# Patient Record
Sex: Female | Born: 1970 | Race: White | Hispanic: No | Marital: Married | Smoking: Former smoker
Health system: Southern US, Community
[De-identification: ages and names within clinical notes are randomized; demographics above are authoritative.]

## PROBLEM LIST (undated history)

## (undated) DIAGNOSIS — R002 Palpitations: Secondary | ICD-10-CM

## (undated) DIAGNOSIS — M26629 Arthralgia of temporomandibular joint, unspecified side: Secondary | ICD-10-CM

## (undated) DIAGNOSIS — K219 Gastro-esophageal reflux disease without esophagitis: Secondary | ICD-10-CM

## (undated) DIAGNOSIS — R350 Frequency of micturition: Secondary | ICD-10-CM

## (undated) HISTORY — DX: Arthralgia of temporomandibular joint, unspecified side: M26.629

## (undated) HISTORY — DX: Gastro-esophageal reflux disease without esophagitis: K21.9

## (undated) HISTORY — DX: Frequency of micturition: R35.0

## (undated) HISTORY — DX: Palpitations: R00.2

---

## 1998-04-09 ENCOUNTER — Other Ambulatory Visit: Admission: RE | Admit: 1998-04-09 | Discharge: 1998-04-09 | Payer: Self-pay | Admitting: Obstetrics & Gynecology

## 1998-11-12 ENCOUNTER — Inpatient Hospital Stay (HOSPITAL_COMMUNITY): Admission: AD | Admit: 1998-11-12 | Discharge: 1998-11-15 | Payer: Self-pay | Admitting: Obstetrics and Gynecology

## 1999-04-20 ENCOUNTER — Other Ambulatory Visit: Admission: RE | Admit: 1999-04-20 | Discharge: 1999-04-20 | Payer: Self-pay | Admitting: Obstetrics & Gynecology

## 2000-05-09 ENCOUNTER — Other Ambulatory Visit: Admission: RE | Admit: 2000-05-09 | Discharge: 2000-05-09 | Payer: Self-pay | Admitting: Gynecology

## 2001-12-13 ENCOUNTER — Emergency Department (HOSPITAL_COMMUNITY): Admission: EM | Admit: 2001-12-13 | Discharge: 2001-12-13 | Payer: Self-pay | Admitting: Emergency Medicine

## 2001-12-25 ENCOUNTER — Emergency Department (HOSPITAL_COMMUNITY): Admission: EM | Admit: 2001-12-25 | Discharge: 2001-12-25 | Payer: Self-pay | Admitting: Emergency Medicine

## 2004-03-10 ENCOUNTER — Other Ambulatory Visit: Admission: RE | Admit: 2004-03-10 | Discharge: 2004-03-10 | Payer: Self-pay | Admitting: Obstetrics & Gynecology

## 2011-01-10 ENCOUNTER — Encounter: Payer: Self-pay | Admitting: Internal Medicine

## 2011-12-22 ENCOUNTER — Ambulatory Visit (INDEPENDENT_AMBULATORY_CARE_PROVIDER_SITE_OTHER): Payer: BC Managed Care – PPO

## 2011-12-22 DIAGNOSIS — R300 Dysuria: Secondary | ICD-10-CM

## 2011-12-22 DIAGNOSIS — N949 Unspecified condition associated with female genital organs and menstrual cycle: Secondary | ICD-10-CM

## 2011-12-22 DIAGNOSIS — N898 Other specified noninflammatory disorders of vagina: Secondary | ICD-10-CM

## 2012-08-10 ENCOUNTER — Ambulatory Visit (INDEPENDENT_AMBULATORY_CARE_PROVIDER_SITE_OTHER): Payer: BC Managed Care – PPO | Admitting: Physician Assistant

## 2012-08-10 VITALS — BP 146/72 | HR 122 | Temp 97.9°F | Resp 16 | Ht 64.0 in | Wt 148.0 lb

## 2012-08-10 DIAGNOSIS — Z23 Encounter for immunization: Secondary | ICD-10-CM

## 2012-08-10 DIAGNOSIS — S61209A Unspecified open wound of unspecified finger without damage to nail, initial encounter: Secondary | ICD-10-CM

## 2012-08-10 DIAGNOSIS — M79609 Pain in unspecified limb: Secondary | ICD-10-CM

## 2012-08-10 NOTE — Progress Notes (Signed)
  Subjective:    Patient ID: Emily Hayden, female    DOB: 05-26-1971, 41 y.o.   MRN: [id number]  HPI 40 yr old CF presents with cut on L pinky finger with sharp new blade while cutting paper.   Review of Systems  All other systems reviewed and are negative.       Objective:   Physical Exam  Nursing note and vitals reviewed. Constitutional: She appears well-developed and well-nourished.  Musculoskeletal:       Hands:         Assessment & Plan:  Wound-finger-skin may or may not re-attach, but the wound is very small and should do fine. Definitely not something that would require a foil graft.  tdap updated.

## 2012-10-30 ENCOUNTER — Ambulatory Visit (INDEPENDENT_AMBULATORY_CARE_PROVIDER_SITE_OTHER): Payer: BC Managed Care – PPO | Admitting: Family Medicine

## 2012-10-30 VITALS — BP 133/81 | HR 111 | Temp 97.6°F | Resp 16 | Ht 64.0 in | Wt 144.4 lb

## 2012-10-30 DIAGNOSIS — M546 Pain in thoracic spine: Secondary | ICD-10-CM

## 2012-10-30 DIAGNOSIS — Z23 Encounter for immunization: Secondary | ICD-10-CM

## 2012-10-30 MED ORDER — HYDROCODONE-ACETAMINOPHEN 5-500 MG PO TABS: 1.0000 | ORAL_TABLET | Freq: Three times a day (TID) | ORAL | Status: DC | PRN

## 2012-10-30 MED ORDER — CYCLOBENZAPRINE HCL 10 MG PO TABS: ORAL_TABLET | ORAL | Status: DC

## 2012-10-30 NOTE — Progress Notes (Signed)
This is a 41 y.o.female who initially felt interscapular pain 2 weeks ago, then spent 6-7 hours moving furniture on Saturday, then this afternoon she felt a little pop and the pain crescendo's.  Has had some more pain in left upper arm than the right..   Patient has a past history of upper back pain for which she usually takes muscle relaxer and ibuprofen  Emily Hayden denies any urinary symptoms, bowel problems, numbness in the legs, loss of motor power. Emily Hayden had no fever.  Emily Hayden has tried ibuprofen.  History reviewed. No pertinent past medical history.   Past Surgical History  Procedure Date  . Cesarean section     Objective:  middle-aged female in no acute distress. Blood pressure 133/81, pulse 111, temperature 97.6 F (36.4 C), temperature source Oral, resp. rate 16, height 5\' 4"  (1.626 m), weight 144 lb 6.4 oz (65.499 kg), last menstrual period 10/09/2012, SpO2 100.00%.Body mass index is 24.79 kg/(m^2). Palpation of the back reveals no point tenderness of the spine  Inspection of the back: Reveals no scoliosis  Motor exam of lower extremity: No abnormal weakness.  Reflexes: Symmetric and normal  Skin exam: normal  Assessment/Plan: Acute upper back pain without acute neurological findings.  1. Thoracic back pain  HYDROcodone-acetaminophen (VICODIN) 5-500 MG per tablet

## 2012-10-31 ENCOUNTER — Telehealth: Payer: Self-pay | Admitting: *Deleted

## 2012-10-31 MED ORDER — CYCLOBENZAPRINE HCL 10 MG PO TABS: 10.0000 mg | ORAL_TABLET | Freq: Three times a day (TID) | ORAL | Status: DC | PRN

## 2012-10-31 NOTE — Telephone Encounter (Signed)
Done

## 2012-10-31 NOTE — Telephone Encounter (Signed)
Pt was seen 11/11 and her rx for flexeril was written for 1 at bedtime but pt stated at pharmacy that she usually take 1 tab every 8 hours.  Can we change the directions?  [place] sent [place] a fax

## 2012-10-31 NOTE — Telephone Encounter (Signed)
Please call in Flexeril 10 tid prn #90, 2 refills

## 2012-12-10 ENCOUNTER — Other Ambulatory Visit: Payer: Self-pay | Admitting: Internal Medicine

## 2013-03-17 ENCOUNTER — Other Ambulatory Visit: Payer: Self-pay | Admitting: Internal Medicine

## 2013-03-17 ENCOUNTER — Other Ambulatory Visit: Payer: Self-pay | Admitting: Family Medicine

## 2013-04-04 ENCOUNTER — Encounter: Payer: Self-pay | Admitting: Internal Medicine

## 2013-04-04 ENCOUNTER — Ambulatory Visit (INDEPENDENT_AMBULATORY_CARE_PROVIDER_SITE_OTHER): Payer: BC Managed Care – PPO | Admitting: Internal Medicine

## 2013-04-04 VITALS — BP 116/66 | HR 102 | Temp 98.6°F | Resp 16 | Ht 64.0 in | Wt 147.6 lb

## 2013-04-04 DIAGNOSIS — J387 Other diseases of larynx: Secondary | ICD-10-CM

## 2013-04-04 DIAGNOSIS — M26629 Arthralgia of temporomandibular joint, unspecified side: Secondary | ICD-10-CM

## 2013-04-04 DIAGNOSIS — R002 Palpitations: Secondary | ICD-10-CM

## 2013-04-04 DIAGNOSIS — M2669 Other specified disorders of temporomandibular joint: Secondary | ICD-10-CM

## 2013-04-04 DIAGNOSIS — K219 Gastro-esophageal reflux disease without esophagitis: Secondary | ICD-10-CM

## 2013-04-04 DIAGNOSIS — R350 Frequency of micturition: Secondary | ICD-10-CM

## 2013-04-05 DIAGNOSIS — K219 Gastro-esophageal reflux disease without esophagitis: Secondary | ICD-10-CM | POA: Insufficient documentation

## 2013-04-05 DIAGNOSIS — R002 Palpitations: Secondary | ICD-10-CM | POA: Insufficient documentation

## 2013-04-05 DIAGNOSIS — M26629 Arthralgia of temporomandibular joint, unspecified side: Secondary | ICD-10-CM

## 2013-04-05 DIAGNOSIS — R350 Frequency of micturition: Secondary | ICD-10-CM | POA: Insufficient documentation

## 2013-04-05 HISTORY — DX: Arthralgia of temporomandibular joint, unspecified side: M26.629

## 2013-04-05 HISTORY — DX: Gastro-esophageal reflux disease without esophagitis: K21.9

## 2013-04-05 HISTORY — DX: Frequency of micturition: R35.0

## 2013-04-05 HISTORY — DX: Palpitations: R00.2

## 2013-04-05 MED ORDER — CYCLOBENZAPRINE HCL 10 MG PO TABS: ORAL_TABLET | ORAL | Status: DC

## 2013-04-05 MED ORDER — PANTOPRAZOLE SODIUM 40 MG PO TBEC: DELAYED_RELEASE_TABLET | ORAL | Status: DC

## 2013-04-05 NOTE — Progress Notes (Signed)
  Subjective:    Patient ID: Emily Hayden, female    DOB: Jan 22, 1971, 42 y.o.   MRN: [id number]  HPI here for routine followup and medicine refills Has had a good recent year By using Flexeril at bedtime without Mobic she has good control of her temporomandibular joint pain Her reflux syndrome is probably laryngeal pharyngeal reflux is well controlled by Protonix although she has been unable to wean off of this/1 chief symptom is noticing premature beats when her reflux is active//another symptom resolved Protonix was persistent nausea  History of palpitations has been fully evaluated by cardiology and no problem has been found  She has had intermittent anxiety with insomnia although uses Xanax 1-2 times a month at the most  She was referred to urology at [facility] to evaluate her urinary frequency and no disorder was discovered  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 Multiple Vitamin CAPS    Sig: Take by mouth 2 (two) times daily.  . chlorpheniramine (CHLOR-TRIMETON) 4 MG tablet    Sig: Take 4 mg by mouth 2 (two) times daily as needed for allergies.  . cyclobenzaprine (FLEXERIL) 10 MG tablet    Sig: TAKE 1 TABLET THREE TIMES DAILY AS NEEDED FOR MUSCLE SPASMS.    Dispense:  90 tablet    Refill:  3  . pantoprazole (PROTONIX) 40 MG tablet    Sig: TAKE 1 TABLET ONCE DAILY.    Dispense:  90 tablet    Refill:  3   1 yr May call for refills of Xanax if needed

## 2014-01-14 ENCOUNTER — Other Ambulatory Visit: Payer: Self-pay | Admitting: Internal Medicine

## 2014-03-23 ENCOUNTER — Other Ambulatory Visit: Payer: Self-pay | Admitting: Physician Assistant

## 2014-04-20 ENCOUNTER — Other Ambulatory Visit: Payer: Self-pay | Admitting: Internal Medicine

## 2014-05-15 ENCOUNTER — Ambulatory Visit (INDEPENDENT_AMBULATORY_CARE_PROVIDER_SITE_OTHER): Payer: BC Managed Care – PPO | Admitting: Internal Medicine

## 2014-05-15 ENCOUNTER — Encounter: Payer: Self-pay | Admitting: Internal Medicine

## 2014-05-15 VITALS — BP 100/72 | HR 119 | Temp 99.1°F | Resp 16 | Ht 64.0 in | Wt 156.6 lb

## 2014-05-15 DIAGNOSIS — M26629 Arthralgia of temporomandibular joint, unspecified side: Secondary | ICD-10-CM

## 2014-05-15 DIAGNOSIS — I498 Other specified cardiac arrhythmias: Secondary | ICD-10-CM

## 2014-05-15 DIAGNOSIS — Z800 Family history of malignant neoplasm of digestive organs: Secondary | ICD-10-CM

## 2014-05-15 DIAGNOSIS — R350 Frequency of micturition: Secondary | ICD-10-CM

## 2014-05-15 DIAGNOSIS — Z5181 Encounter for therapeutic drug level monitoring: Secondary | ICD-10-CM

## 2014-05-15 DIAGNOSIS — Z79899 Other long term (current) drug therapy: Secondary | ICD-10-CM

## 2014-05-15 DIAGNOSIS — R000 Tachycardia, unspecified: Secondary | ICD-10-CM

## 2014-05-15 DIAGNOSIS — K219 Gastro-esophageal reflux disease without esophagitis: Secondary | ICD-10-CM

## 2014-05-15 DIAGNOSIS — R002 Palpitations: Secondary | ICD-10-CM

## 2014-05-15 DIAGNOSIS — R3981 Functional urinary incontinence: Secondary | ICD-10-CM

## 2014-05-15 DIAGNOSIS — M2669 Other specified disorders of temporomandibular joint: Secondary | ICD-10-CM

## 2014-05-15 DIAGNOSIS — J387 Other diseases of larynx: Secondary | ICD-10-CM

## 2014-05-15 LAB — CBC WITH DIFFERENTIAL/PLATELET
BASOS PCT: 0 % (ref 0–1)
Basophils Absolute: 0 10*3/uL (ref 0.0–0.1)
EOS ABS: 0.1 10*3/uL (ref 0.0–0.7)
Eosinophils Relative: 2 % (ref 0–5)
HEMATOCRIT: 38.3 % (ref 36.0–46.0)
HEMOGLOBIN: 13.1 g/dL (ref 12.0–15.0)
LYMPHS ABS: 1.3 10*3/uL (ref 0.7–4.0)
Lymphocytes Relative: 36 % (ref 12–46)
MCH: 30.6 pg (ref 26.0–34.0)
MCHC: 34.2 g/dL (ref 30.0–36.0)
MCV: 89.5 fL (ref 78.0–100.0)
MONOS PCT: 12 % (ref 3–12)
Monocytes Absolute: 0.4 10*3/uL (ref 0.1–1.0)
NEUTROS ABS: 1.8 10*3/uL (ref 1.7–7.7)
NEUTROS PCT: 50 % (ref 43–77)
Platelets: 421 10*3/uL — ABNORMAL HIGH (ref 150–400)
RBC: 4.28 MIL/uL (ref 3.87–5.11)
RDW: 13.9 % (ref 11.5–15.5)
WBC: 3.6 10*3/uL — ABNORMAL LOW (ref 4.0–10.5)

## 2014-05-15 LAB — LIPID PANEL
Cholesterol: 229 mg/dL — ABNORMAL HIGH (ref 0–200)
HDL: 77 mg/dL (ref >39)
LDL Cholesterol: 130 mg/dL — ABNORMAL HIGH (ref 0–99)
TRIGLYCERIDES: 108 mg/dL (ref <150)
Total CHOL/HDL Ratio: 3 Ratio
VLDL: 22 mg/dL (ref 0–40)

## 2014-05-15 LAB — COMPREHENSIVE METABOLIC PANEL
ALBUMIN: 4.4 g/dL (ref 3.5–5.2)
ALT: 8 U/L (ref 0–35)
AST: 15 U/L (ref 0–37)
Alkaline Phosphatase: 44 U/L (ref 39–117)
BUN: 17 mg/dL (ref 6–23)
CO2: 29 meq/L (ref 19–32)
Calcium: 9.4 mg/dL (ref 8.4–10.5)
Chloride: 99 mEq/L (ref 96–112)
Creat: 0.76 mg/dL (ref 0.50–1.10)
Glucose, Bld: 82 mg/dL (ref 70–99)
POTASSIUM: 4.5 meq/L (ref 3.5–5.3)
SODIUM: 137 meq/L (ref 135–145)
TOTAL PROTEIN: 6.8 g/dL (ref 6.0–8.3)
Total Bilirubin: 0.3 mg/dL (ref 0.2–1.2)

## 2014-05-15 LAB — MAGNESIUM: MAGNESIUM: 1.9 mg/dL (ref 1.5–2.5)

## 2014-05-15 MED ORDER — CYCLOBENZAPRINE HCL 10 MG PO TABS: 10.0000 mg | ORAL_TABLET | Freq: Three times a day (TID) | ORAL | Status: DC | PRN

## 2014-05-15 MED ORDER — PANTOPRAZOLE SODIUM 40 MG PO TBEC: 40.0000 mg | DELAYED_RELEASE_TABLET | Freq: Every day | ORAL | Status: DC

## 2014-05-15 NOTE — Progress Notes (Signed)
This chart was scribed for Tonye Pearson, MD by Joaquin Music, ED Scribe. This patient was seen in room Room/bed 25 and the patient's care was started at 1:12 PM. Subjective:    Patient ID: Emily Hayden, female    DOB: 1971/09/23, 43 y.o.   MRN: [id number] Chief Complaint  Patient presents with  . Follow-up    Palpitations, TMJ   HPI Emily Hayden is a 43 y.o. female who presents to the [facility] for 1-yr F/U apt. Pt states she has been well. She states she has gone to see  psychiatrist, PAC. Ellis Savage and states the psychiatrist gave her a prescription for Xanax for 1-week. States she takes 0.5 mg at night and states she does not "have her bladder issues anymore". States her sisters are seeking therapy with Dr. Lance Coon as well Francis Dowse believes their mother is the cause of majority of their problems. States her stress levels have decreased and reports know how to manage her mother. She states her children and marriage are doing well.  Pt requesting shingles and pneumonia vaccine if she needs or qualifies for it. She is requesting to have her vitamin D levels checked due to a recent reading that expressed low vit D levels and a linkage with colon cancer; her father had colon cancer and would like to a colonoscopy done to r/o colon cancer.  Pt states her palpitation episodes are sporadic and still occuring. States her mother informed her last month that all 4 (maternal's) grandparents died from heart attacks and great aunt died from Prinzmetel's variant angina. Pt states her palpitations and TMJ "occurs in clusters"; states she can go 2 months without sx but when the sx present, she has constant sx for a few days. States she has a sensation of HR stopping, fluttering, and racing. States she has had 2 syncopal episodes in her life and states this sensation feels similar to prior syncopal episodes. She states she has had "a few times" where she is driving and feels an episode of syncope  bound to take place but does not; she fears that this can become an issue one day. Pt states when coughing, the sensation subsides. Reports having EKG, echocardiogram and  wearing cardiac monitor for 7 days-24 hours a day, about 12 years ago that WNL. States she is still taking Protonic for LPR-attempts at withdr have failed.  Patient Active Problem List   Diagnosis Date Noted  . 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 Urinary frequency 04/05/2013  . Palpitations 04/05/2013  . Laryngopharyngeal reflux 04/05/2013   Current outpatient prescriptions:ALPRAZolam (XANAX) 0.5 MG tablet, Take 0.5 mg by mouth at bedtime as needed., Disp: , Rfl: ;  chlorpheniramine (CHLOR-TRIMETON) 4 MG tablet, Take 4 mg by mouth 2 (two) times daily as needed for allergies., Disp: , Rfl: ;  cyclobenzaprine (FLEXERIL) 10 MG tablet, Take 1 tablet (10 mg total) by mouth 3 (three) times daily as needed. PATIENT NEEDS OFFICE VISIT FOR ADDITIONAL REFILLS, Disp: 90 tablet, Rfl: 0 loratadine (CLARITIN) 10 MG tablet, Take 10 mg by mouth daily., Disp: , Rfl: ;  Multiple Vitamin CAPS, Take by mouth 2 (two) times daily., Disp: , Rfl: ;  pantoprazole (PROTONIX) 40 MG tablet, Take 1 tablet (40 mg total) by mouth daily. PATIENT NEEDS OFFICE VISIT FOR ADDITIONAL REFILLS, Disp: 30 tablet, Rfl: 0 HYDROcodone-acetaminophen (VICODIN) 5-500 MG per tablet, Take 1 tablet by mouth every 8 (eight) hours as needed for pain., Disp: 20 tablet, Rfl: 0  Review of  Systems  Constitutional: Negative for activity change, appetite change, fatigue and unexpected weight change.  Respiratory: Negative for cough and shortness of breath.   Cardiovascular: Positive for palpitations. Negative for chest pain and leg swelling.  Gastrointestinal: Negative for abdominal pain, blood in stool and rectal pain.  Genitourinary: Negative for difficulty urinating and menstrual problem.  Skin: Negative for rash.  Neurological: Negative for headaches.    Psychiatric/Behavioral: Negative for sleep disturbance, dysphoric mood and decreased concentration.  All other systems reviewed and are negative.  Objective:   Physical Exam  Nursing note and vitals reviewed. Constitutional: She is oriented to person, place, and time. She appears well-developed and well-nourished. No distress.  HENT:  Head: Normocephalic and atraumatic.  Right Ear: External ear normal.  Left Ear: External ear normal.  Eyes: Conjunctivae and EOM are normal. Pupils are equal, round, and reactive to light.  Neck: Normal range of motion. Neck supple. No thyromegaly present.  No adenopathy.  Cardiovascular: Normal heart sounds and intact distal pulses.  An irregular rhythm present. Tachycardia present.  Exam reveals no gallop and no friction rub.   No murmur heard. Irregular tachycardia at 115.  Pulmonary/Chest: Effort normal and breath sounds normal.  Abdominal: Bowel sounds are normal.  Musculoskeletal: Normal range of motion. She exhibits no edema.  Lymphadenopathy:    She has no cervical adenopathy.  Neurological: She is alert and oriented to person, place, and time. She has normal reflexes. No cranial nerve deficit.  Skin: Skin is warm and dry. She is not diaphoretic.  Psychiatric: She has a normal mood and affect. Her behavior is normal. Thought content normal.   Triage Vitals:BP 100/72  Pulse 119  Temp(Src) 99.1 F (37.3 C) (Oral)  Resp 16  Ht 5\' 4"  (1.626 m)  Wt 156 lb 9.6 oz (71.033 kg)  BMI 26.87 kg/m2  SpO2 96%  LMP 05/08/2014 Assessment & Plan:  Laryngopharyngeal reflux - Plan: CBC with Differential, Comprehensive metabolic panel, Ambulatory referral to Gastroenterology  Palpitations - Plan: CBC with Differential, Comprehensive metabolic panel, TSH, Lipid panel, Ambulatory referral to Cardiology  Urinary frequency  Family hx of colon cancer requiring screening colonoscopy - Plan: Ambulatory referral to Gastroenterology  Encounter for monitoring  long-term proton pump inhibitor therapy - Plan: Vitamin D, 25-hydroxy, Magnesium  Sinus tachycardia - Plan: Ambulatory referral to Cardiology  TMJ pain dysfunction syndrome  Meds ordered this encounter  Medications  . pantoprazole (PROTONIX) 40 MG tablet    Sig: Take 1 tablet (40 mg total) by mouth daily.    Dispense:  90 tablet    Refill:  3  . cyclobenzaprine (FLEXERIL) 10 MG tablet    Sig: Take 1 tablet (10 mg total) by mouth hs for TMJ    Dispense:  90 tablet    Refill:  3     I have completed the patient encounter in its entirety as documented by the scribe, with editing by me where necessary. [place] P. Merla Richesoolittle, M.D.

## 2014-05-16 LAB — TSH: TSH: 2.335 u[IU]/mL (ref 0.350–4.500)

## 2014-05-16 LAB — VITAMIN D 25 HYDROXY (VIT D DEFICIENCY, FRACTURES): Vit D, 25-Hydroxy: 17 ng/mL — ABNORMAL LOW (ref 30–89)

## 2014-05-31 ENCOUNTER — Encounter: Payer: Self-pay | Admitting: Internal Medicine

## 2014-06-13 ENCOUNTER — Encounter: Payer: Self-pay | Admitting: *Deleted

## 2014-06-17 ENCOUNTER — Ambulatory Visit (INDEPENDENT_AMBULATORY_CARE_PROVIDER_SITE_OTHER): Payer: BC Managed Care – PPO | Admitting: Family Medicine

## 2014-06-17 ENCOUNTER — Ambulatory Visit (INDEPENDENT_AMBULATORY_CARE_PROVIDER_SITE_OTHER): Payer: BC Managed Care – PPO

## 2014-06-17 VITALS — BP 118/72 | HR 98 | Temp 97.9°F | Resp 16

## 2014-06-17 DIAGNOSIS — M25579 Pain in unspecified ankle and joints of unspecified foot: Secondary | ICD-10-CM

## 2014-06-17 DIAGNOSIS — S99929A Unspecified injury of unspecified foot, initial encounter: Secondary | ICD-10-CM

## 2014-06-17 DIAGNOSIS — S99922A Unspecified injury of left foot, initial encounter: Secondary | ICD-10-CM

## 2014-06-17 DIAGNOSIS — M25572 Pain in left ankle and joints of left foot: Secondary | ICD-10-CM

## 2014-06-17 DIAGNOSIS — S8990XA Unspecified injury of unspecified lower leg, initial encounter: Secondary | ICD-10-CM

## 2014-06-17 DIAGNOSIS — S99919A Unspecified injury of unspecified ankle, initial encounter: Secondary | ICD-10-CM

## 2014-06-17 MED ORDER — HYDROCODONE-ACETAMINOPHEN 5-500 MG PO TABS: 1.0000 | ORAL_TABLET | Freq: Three times a day (TID) | ORAL | Status: DC | PRN

## 2014-06-17 NOTE — Patient Instructions (Signed)
Toe Fracture Your caregiver has diagnosed you as having a fractured toe. A toe fracture is a break in the bone of a toe. "Buddy taping" is a way of splinting your broken toe, by taping the broken toe to the toe next to it. This "buddy taping" will keep the injured toe from moving beyond normal range of motion. Buddy taping also helps the toe heal in a more normal alignment. It may take 6 to 8 weeks for the toe injury to heal. HOME CARE INSTRUCTIONS   Leave your toes taped together for as long as directed by your caregiver or until you see a doctor for a follow-up examination. You can change the tape after bathing. Always use a small piece of gauze or cotton between the toes when taping them together. This will help the skin stay dry and prevent infection.  Apply ice to the injury for 15-20 minutes each hour while awake for the first 2 days. Put the ice in a plastic bag and place a towel between the bag of ice and your skin.  After the first 2 days, apply heat to the injured area. Use heat for the next 2 to 3 days. Place a heating pad on the foot or soak the foot in warm water as directed by your caregiver.  Keep your foot elevated as much as possible to lessen swelling.  Wear sturdy, supportive shoes. The shoes should not pinch the toes or fit tightly against the toes.  Your caregiver may prescribe a rigid shoe if your foot is very swollen.  Your may be given crutches if the pain is too great and it hurts too much to walk.  Only take over-the-counter or prescription medicines for pain, discomfort, or fever as directed by your caregiver.  If your caregiver has given you a follow-up appointment, it is very important to keep that appointment. Not keeping the appointment could result in a chronic or permanent injury, pain, and disability. If there is any problem keeping the appointment, you must call back to this facility for assistance. SEEK MEDICAL CARE IF:   You have increased pain or swelling,  not relieved with medications.  The pain does not get better after 1 week.  Your injured toe is cold when the others are warm. SEEK IMMEDIATE MEDICAL CARE IF:   The toe becomes cold, numb, or white.  The toe becomes hot (inflamed) and red. Document Released: 12/03/2000 Document Revised: 02/28/2012 Document Reviewed: 07/22/2008 Exit[employer] Patient Information 2015 [employer]. This information is not intended to replace advice given to you by your health care provider. Make sure you discuss any questions you have with your health care provider. Toe Fracture  with Rehab A fracture is a break in the bone that can be either partial or complete. Fractures of the toe bones may or may not include the joints that separate the bones. SYMPTOMS   Severe pain over the fracture site at the time of injury that may persist for an extend period of time.  Pain, tenderness, inflammation, and/or bruising (contusion) over the fracture site.  Visible deformity, if the bone fragments are not properly aligned (displaced fracture).  Signs of vascular damage: numbness or coldness (uncommon). CAUSES  Toe fractures occur when a force is placed on the bone that is greater than it can withstand.  Direct hit (trauma) to the toe.  Indirect trauma to the toe, such as forcefully pivoting on a planted foot. RISK INCREASES WITH:  Performing activities barefoot (i.e. ballet, gymnastics).    Wearing shoes with little support or protection.  Sports with cleats (i.e. football, rugby, lacrosse, soccer).  Bone disease (i.e. osteoporosis, bone tumors). PREVENTION   Wear properly fitted and protective shoes.  Protect previously injured toes with tape or padding. PROGNOSIS  If treated properly, toe fractures usually heal within 4 to 6 weeks. RELATED COMPLICATIONS   Failure of the fracture to heal (nonunion).  Healing of the fracture in a poor position (malunion).  Recurring symptoms.  Recurring symptoms  that result in a chronic problem.  Excessive bleeding, causing pressure on nerves and blood vessels (rare).  Arthritis of the affected joints.  Stopping of bone growth in children.  Infection in fractures where the skin is broken over the fracture (open fracture).  Shortening of injured bones. TREATMENT  Treatment first involves the use of ice and medicine to reduce pain and inflammation. The toe should be restrained for a period of time to allow for healing, usually about 4 weeks. Your caregiver may advise wearing a hard-soled shoe to minimize stress on the healing bone. Surgery is uncommon for this injury, but may be necessary if the fracture is severely displaced or if the bone pushes through the skin. Surgery typically involves the use of screws, pins, and/or plates to hold the fracture in place. After surgery, restraint of the foot is necessary. MEDICATION   If pain medicine is necessary, nonsteroidal anti-inflammatory medications (aspirin and ibuprofen), or other minor pain relievers (acetaminophen), are often recommended.  Do not take pain medicine for 7 days before surgery.  Prescription pain relievers may be given if your caregiver thinks they are needed. Use only as directed and only as much as you need. COLD THERAPY  Cold treatment (icing) relieves pain and reduces inflammation. Cold treatment should be applied for 10 to 15 minutes every 2 to 3 hours, and immediately after activity that aggravates your symptoms. Use ice packs or an ice massage. SEEK MEDICAL CARE IF:   Treatment does not seem to help, or the condition gets worse.  Any medicines produce negative side effects.  Any complications from surgery occur:  Pain, numbness, or coldness in the affected foot.  Discoloration beneath the toenails (blue or gray) of the affected foot.  Signs of infection (fever, pain, inflammation, redness, or persistent bleeding). EXERCISES RANGE OF MOTION (ROM) AND STRETCHING EXERCISES  - Toe Fracture (Phalangeal) These exercises may help you when beginning to rehabilitate your injury. Your symptoms may resolve with or without further involvement from your physician, physical therapist or athletic trainer. While completing these exercises, remember:   Restoring tissue flexibility helps normal motion to return to the joints. This allows healthier, less painful movement and activity.  An effective stretch should be held for at least 30 seconds.  A stretch should never be painful. You should only feel a gentle lengthening or release in the stretched tissue. RANGE OF MOTION - Dorsi/Plantar Flexion  While sitting with your right / left knee straight, draw the top of your foot upwards by flexing your ankle. Then reverse the motion, pointing your toes downward.  Hold each position for __________ seconds.  After completing your first set of exercises, repeat this exercise with your knee bent. Repeat __________ times. Complete this exercise __________ times per day.  RANGE OF MOTION - Ankle Alphabet Imagine your right / left big toe is a pen. Keeping your hip and knee still, write out the entire alphabet with your "pen." Make the letters as large as you can without increasing any discomfort.   Repeat __________ times. Complete this exercise __________ times per day.  RANGE OF MOTION - Toe Extension, Flexion  Sit with your right / left leg crossed over your opposite knee.  Grasp your toes and gently pull them back toward the top of your foot. You should feel a stretch on the bottom of your toes and foot.  Hold this stretch for __________ seconds.  Now, gently pull your toes toward the bottom of your foot. You should feel a stretch on the top of your toes and foot.  Hold this stretch for __________ seconds. Repeat __________ times. Complete this stretch__________ times per day.  STRENGTHENING EXERCISES - Toe Fracture (Phalangeal) These exercises may help you when beginning to  rehabilitate your injury. They may resolve your symptoms with or without further involvement from your physician, physical therapist or athletic trainer. While completing these exercises, remember:   Muscles can gain both the endurance and the strength needed for everyday activities through controlled exercises.  Complete these exercises as instructed by your physician, physical therapist or athletic trainer. Increase the resistance and repetitions only as guided.  You may experience muscle soreness or fatigue, but the pain or discomfort you are trying to eliminate should never worsen during these exercises. If this pain does get worse, stop and make sure you are following the directions exactly. If the pain is still present after adjustments, discontinue the exercise until you can discuss the trouble with your clinician. STRENGTH - Towel Curls  Sit in a chair, on a non-carpeted surface.  Place your foot on a towel, keeping your heel on the floor.  Pull the towel toward your heel only by curling your toes. Keep your heel on the floor.  If instructed by your physician, physical therapist or athletic trainer, add ____________________ at the end of the towel. Repeat __________ times. Complete this exercise __________ times per day. Document Released: 12/06/2005 Document Revised: 02/28/2012 Document Reviewed: 03/20/2009 Exit[employer] Patient Information 2015 [employer]. This information is not intended to replace advice given to you by your health care provider. Make sure you discuss any questions you have with your health care provider.  

## 2014-06-17 NOTE — Progress Notes (Addendum)
Subjective:    Patient ID: Emily Hayden, female    DOB: 31-Aug-1971, 43 y.o.   MRN: [id number] This chart was scribed for Norberto SorensonEva Shaw, MD by Nicholos Johnsenise Iheanachor, Medical Scribe. The patient was seen in room 9. This patient's care was started at 3:10 PM.   Foot Injury  Pertinent negatives include no numbness.   Chief Complaint  Patient presents with  . Foot Injury    foot and toe injury, happened Sunday morning at home   HPI Comments: Emily Hayden is a 43 y.o. female who presents to the [facility] complaining of left foot and toe pain; onset 1 day prior. States she accidently kicked a chair at home when she was not looking where she was walking - it had been moved and the chair actually flew across the room and believes she may have broken her toe. tH Pain primarily in the distal third phalanx. Reports no pain with movement of the 2nd or 4th toe. No ankle pain. Reports pain with ambulation and standing. Has been using ice and elevation to alleviate pain and decrease swelling but having progressive bruising today. Also treating pain with Ibuprofen; last dosage before bed last night.    Patient Active Problem List   Diagnosis Date Noted  . 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 Urinary frequency 04/05/2013  . Palpitations 04/05/2013  . Laryngopharyngeal reflux 04/05/2013   Past Medical History  Diagnosis Date  . Laryngopharyngeal reflux 04/05/2013  . Palpitations 04/05/2013    Cardiac evaluation including echo Holter and EKG were all normal   . 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 Urinary frequency 04/05/2013    [facility]    Past Surgical History  Procedure Laterality Date  . Cesarean section     Allergies  Allergen Reactions  . Ciprocinonide [Fluocinolone]   . Codeine   . Compazine [Prochlorperazine Edisylate]    Prior to Admission medications   Medication Sig Start Date End Date Taking? Authorizing  Provider  ALPRAZolam Prudy Feeler(XANAX) 0.5 MG tablet Take 0.5 mg by mouth at bedtime as needed.   Yes Historical Provider, MD  chlorpheniramine (CHLOR-TRIMETON) 4 MG tablet Take 4 mg by mouth 2 (two) times daily as needed for allergies.   Yes Historical Provider, MD  cyclobenzaprine (FLEXERIL) 10 MG tablet Take 1 tablet (10 mg total) by mouth 3 (three) times daily as needed. 05/15/14  Yes Tonye Pearsonobert P Doolittle, MD  loratadine (CLARITIN) 10 MG tablet Take 10 mg by mouth daily.   Yes Historical Provider, MD  Multiple Vitamin CAPS Take by mouth 2 (two) times daily.   Yes Historical Provider, MD  pantoprazole (PROTONIX) 40 MG tablet Take 1 tablet (40 mg total) by mouth daily. 05/15/14  Yes Tonye Pearsonobert P Doolittle, MD  HYDROcodone-acetaminophen (VICODIN) 5-500 MG per tablet Take 1 tablet by mouth every 8 (eight) hours as needed for pain. 10/30/12   Elvina SidleKurt Lauenstein, MD   History   Social History  . Marital Status: Married    Spouse Name: N/A    Number of Children: N/A  . Years of Education: N/A   Occupational History  . Not on file.   Social History Main Topics  . Smoking status: Former Games developermoker  . Smokeless tobacco: Not on file  . Alcohol Use: Not on file  . Drug Use: Not on file  . Sexual Activity: Not on file   Other Topics Concern  . Not on file   Social History  Narrative  . No narrative on file   Review of Systems  Constitutional: Positive for activity change. Negative for fever, chills and unexpected weight change.  Musculoskeletal: Positive for arthralgias, gait problem, joint swelling and myalgias.  Skin: Positive for color change. Negative for pallor, rash and wound.  Neurological: Negative for weakness and numbness.  Hematological: Does not bruise/bleed easily.  Psychiatric/Behavioral: Positive for sleep disturbance.   Triage vitals: BP 118/72  Pulse 98  Temp(Src) 97.9 F (36.6 C) (Oral)  Resp 16  SpO2 100%  LMP 05/22/2014  Objective:  Physical Exam  Vitals reviewed. Constitutional:  She is oriented to person, place, and time. She appears well-developed and well-nourished. No distress.  HENT:  Head: Normocephalic and atraumatic.  Eyes: Conjunctivae and EOM are normal.  Neck: Neck supple. No tracheal deviation present.  Cardiovascular: Normal rate.   Pulses:      Dorsalis pedis pulses are 2+ on the right side, and 2+ on the left side.       Posterior tibial pulses are 2+ on the right side, and 2+ on the left side.  Pulmonary/Chest: Effort normal. No respiratory distress.  Musculoskeletal: Normal range of motion.       Left foot: She exhibits tenderness.  No pain with passive movement of 2nd or 4th toe but severe pain with passive movement of 3rd toe. Good area of ecchymosis along distal 3rd and 4th metatarsal and MTP joint. Mild active ROM in all toes. No significant pain along plantar aspect of metatarsals or with palpation of 3rd phalanx.  Neurological: She is alert and oriented to person, place, and time.  Skin: Skin is warm and dry.  Psychiatric: She has a normal mood and affect. Her behavior is normal.   [facility] reading (PRIMARY) by Dr. Clelia CroftShaw: Left foot x-ray: fracture of the shaft of proximal third phalanx.   EXAM: LEFT FOOT - COMPLETE 3+ VIEW  COMPARISON: None.  FINDINGS: Frontal, oblique, and lateral views were obtained. There is an obliquely oriented fracture through the midportion of the third proximal phalanx in essentially anatomic alignment. There is no other appreciable fracture. No dislocation. Joint spaces appear intact.  IMPRESSION: Obliquely oriented fracture through third proximal phalanx.  Assessment & Plan:  Fracture of the shaft of proximal third phalanx. Will buddy tape the 2nd and 3rd phalanx together. Patient will be placed in a post-op shoe to wear for 1 month. Told to recheck in clinic in 1 month to see if okay to transfer from post op shoe to firm sole shoes. Advised to continue using ice and elevation for healing. Okay to walk short  distances and gradually increase distance as tolerated. Pain medication given for use in evenings as needed.   I personally performed the services described in this documentation, which was scribed in my presence. The recorded information has been reviewed and is accurate.   Billed by fracture code, all care covered under this code for the next 90 days - OV should be coded as [id number] for no charge.

## 2014-06-20 ENCOUNTER — Institutional Professional Consult (permissible substitution): Payer: Self-pay | Admitting: Cardiology

## 2014-06-25 ENCOUNTER — Encounter: Payer: Self-pay | Admitting: Internal Medicine

## 2014-07-05 ENCOUNTER — Encounter: Payer: Self-pay | Admitting: Cardiology

## 2014-07-19 ENCOUNTER — Ambulatory Visit (INDEPENDENT_AMBULATORY_CARE_PROVIDER_SITE_OTHER): Payer: BC Managed Care – PPO | Admitting: Internal Medicine

## 2014-07-19 ENCOUNTER — Ambulatory Visit (INDEPENDENT_AMBULATORY_CARE_PROVIDER_SITE_OTHER): Payer: BC Managed Care – PPO

## 2014-07-19 VITALS — BP 152/72 | HR 112 | Temp 97.5°F | Resp 16 | Ht 65.0 in | Wt 165.0 lb

## 2014-07-19 DIAGNOSIS — S99922D Unspecified injury of left foot, subsequent encounter: Secondary | ICD-10-CM

## 2014-07-19 DIAGNOSIS — Z5189 Encounter for other specified aftercare: Secondary | ICD-10-CM

## 2014-07-19 DIAGNOSIS — M7710 Lateral epicondylitis, unspecified elbow: Secondary | ICD-10-CM

## 2014-07-19 DIAGNOSIS — S99929A Unspecified injury of unspecified foot, initial encounter: Secondary | ICD-10-CM

## 2014-07-19 DIAGNOSIS — S8990XA Unspecified injury of unspecified lower leg, initial encounter: Secondary | ICD-10-CM

## 2014-07-19 DIAGNOSIS — S99919A Unspecified injury of unspecified ankle, initial encounter: Secondary | ICD-10-CM

## 2014-07-19 DIAGNOSIS — M7711 Lateral epicondylitis, right elbow: Secondary | ICD-10-CM

## 2014-07-19 NOTE — Progress Notes (Addendum)
Subjective:  This chart was scribed for Dr. Harrel Lemon. Merla Riches, MD  by Ashley Jacobs, [facility] Scribe. The patient was seen in room and the patient's care was started at 2:23 PM.  Chief Complaint  Patient presents with   Follow-up    left foot repeat xray      Patient ID: Emily Hayden, female    DOB: February 08, 1971, 43 y.o.   MRN: [id number]  07/19/2014  Follow-up   HPI HPI Comments: Emily Hayden is a 43 y.o. female who has 2 problems.  For the past 2 weeks she has had pain in her right elbow whenever she tries to lift anything or when she tries to open jars. She is not aware of any injury. She has not had any swelling or redness about the elbow. There is no history of prior elbow injury. No nocturnal pain but this is getting in the way of her daily activities.  She also needs a repeat x-ray of her left foot. Pt injured her third two after she accidentally accidently kicked a chair while walking in the dark prior to OV 06/17/14. Pt states the chair "flew across the room". She has pain and swelling to the affected toe. She is wearing a post-op shoe and only removes it to shower. The pain is worse with walking and with bearing weight. Not much better. The second toe to which her third toe is buddy taped hurts a lot at night.   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       cyclobenzaprine (FLEXERIL) 10 MG tablet Take 1 tablet (10 mg total) by mouth 3 (three) times daily as needed.  90 tablet  3   loratadine (CLARITIN) 10 MG tablet Take 10 mg by mouth daily.       Multiple Vitamin CAPS Take by mouth 2 (two) times daily.       pantoprazole (PROTONIX) 40  MG tablet Take 1 tablet (40 mg total) by mouth daily.  90 tablet  3   chlorpheniramine (CHLOR-TRIMETON) 4 MG tablet Take 4 mg by mouth 2 (two) times daily as needed for allergies.       HYDROcodone-acetaminophen (VICODIN) 5-500 MG per tablet Take 1 tablet by mouth every 8 (eight) hours as needed for pain.  20 tablet  0       Review of Systems  Musculoskeletal: Positive for arthralgias, gait problem, joint swelling and myalgias.   Objective:    Physical Exam  Nursing note and vitals reviewed. Constitutional: She is oriented to person, place, and time. She appears well-developed and well-nourished.  HENT:  Head: Normocephalic.  Eyes: Pupils are equal, round, and reactive to light.  Neck: Normal range of motion.  Cardiovascular: Normal rate.   Pulmonary/Chest: Effort normal. No respiratory distress.  Musculoskeletal: Normal range of motion. She exhibits tenderness. She exhibits no edema.  Tenderness of the  Proximal phalanx of #3 on the L No swelling or ecchymosis.  Flexion and extension are uncomfortable  The right elbow has lateral epicondyl tenderness with palpation but ROM is normal w/o pain She has a full grip w/o pain.   Neurological: She is alert and oriented to person, place, and time.  Skin: Skin is warm and dry. She is not diaphoretic.  Psychiatric: She has a normal mood and affect. Her behavior is normal.    Filed Vitals:   07/19/14 1410  BP: 152/72  Pulse: 112  Temp: 97.5 F (36.4 C)  TempSrc: Oral  Resp: 16  Height: 5\' 5"  (1.651 m)  Weight: 165 lb (74.844 kg)  SpO2: 99%    [facility] reading (PRIMARY) by  Dr. Josephina Gipoolittle=no new bone at fx site      Assessment & Plan:  Lateral epicondylitis of elbow, right  Friction massage/range of motion exercises/ibuprofen if desired/ice if desired  Followup to 4 weeks if not well-might consider physical therapy with ultrasound  Toe injury, left, subsequent encounter - Plan: DG Toe 3rd Left  There is no evidence for  healing at this point and so we will continue buddy taping and protecting her flexion with a stiff shoe  She is to avoid wearing the buddy tape at night to see if the pain in the second toe resolves  Recheck 3-4 weeks   I personally performed the services described in this documentation, which was scribed in my presence. The recorded information has been reviewed and is accurate.

## 2014-08-19 ENCOUNTER — Ambulatory Visit (INDEPENDENT_AMBULATORY_CARE_PROVIDER_SITE_OTHER): Payer: BC Managed Care – PPO | Admitting: Internal Medicine

## 2014-08-19 ENCOUNTER — Ambulatory Visit (INDEPENDENT_AMBULATORY_CARE_PROVIDER_SITE_OTHER): Payer: BC Managed Care – PPO

## 2014-08-19 VITALS — BP 130/78 | HR 112 | Temp 98.0°F | Resp 16 | Ht 65.0 in | Wt 165.0 lb

## 2014-08-19 DIAGNOSIS — S92912S Unspecified fracture of left toe(s), sequela: Secondary | ICD-10-CM

## 2014-08-19 DIAGNOSIS — S8290XS Unspecified fracture of unspecified lower leg, sequela: Secondary | ICD-10-CM

## 2014-08-19 NOTE — Progress Notes (Signed)
   Subjective:    Patient ID: Emily Hayden, female    DOB: 11/18/1971, 43 y.o.   MRN: [id number]  HPI 43 year old pt is here for a follow up on her left foot 3rd toe. She was injured about 11 weeks ago. When she came in a month ago, Dr. Merla Riches told her that she had a spiral fracture when taking the xray from a different view. She says it still hurts. She can't tell if it hurts due to immobility or because its still broken. Does not hurt when being touched. No pain with walking. Kept her toe wrapped up since its been broken.   No pain when walking, the buddy tape rubs and hurts Review of Systems     Objective:   Physical Exam Left foot 3rd toe not tender to palpate, has full active rom with no pain  NMSV intact  [facility] reading (PRIMARY) by  Dr. Perrin Maltese fx healed, good callus         Assessment & Plan:  Toe fx healed

## 2014-08-19 NOTE — Progress Notes (Signed)
   Subjective:    Patient ID: Emily Hayden, female    DOB: 1971-01-01, 43 y.o.   MRN: [id number]  HPI    Review of Systems     Objective:   Physical Exam        Assessment & Plan:

## 2014-08-23 ENCOUNTER — Telehealth: Payer: Self-pay

## 2014-08-23 NOTE — Telephone Encounter (Signed)
Patient would like to speak with Dr Merla Riches regarding her broke toe.    Best#: [phone]

## 2014-08-27 NOTE — Telephone Encounter (Signed)
LM for pt to rtn call. 

## 2014-09-25 ENCOUNTER — Ambulatory Visit: Payer: BC Managed Care – PPO | Admitting: Internal Medicine

## 2014-10-01 ENCOUNTER — Encounter: Payer: Self-pay | Admitting: Internal Medicine

## 2014-12-04 ENCOUNTER — Ambulatory Visit (INDEPENDENT_AMBULATORY_CARE_PROVIDER_SITE_OTHER): Payer: BC Managed Care – PPO | Admitting: Family Medicine

## 2014-12-04 VITALS — BP 124/80 | HR 105 | Temp 98.0°F | Resp 20 | Ht 63.5 in | Wt 167.0 lb

## 2014-12-04 DIAGNOSIS — R300 Dysuria: Secondary | ICD-10-CM

## 2014-12-04 LAB — POCT URINALYSIS DIPSTICK
BILIRUBIN UA: NEGATIVE
Blood, UA: NEGATIVE
Glucose, UA: NEGATIVE
Ketones, UA: NEGATIVE
LEUKOCYTES UA: NEGATIVE
Nitrite, UA: NEGATIVE
PH UA: 7
PROTEIN UA: NEGATIVE
SPEC GRAV UA: 1.015
Urobilinogen, UA: 0.2

## 2014-12-04 LAB — POCT UA - MICROSCOPIC ONLY
CRYSTALS, UR, HPF, POC: NEGATIVE
Casts, Ur, LPF, POC: NEGATIVE
Mucus, UA: NEGATIVE
Yeast, UA: NEGATIVE

## 2014-12-04 MED ORDER — SULFAMETHOXAZOLE-TRIMETHOPRIM 400-80 MG PO TABS: 1.0000 | ORAL_TABLET | Freq: Two times a day (BID) | ORAL | Status: DC

## 2014-12-04 NOTE — Patient Instructions (Signed)

## 2014-12-04 NOTE — Progress Notes (Signed)
Patient ID: Georgia DomJerusha Zavada MRN: [id number], DOB: 05-16-1971, 43 y.o. Date of Encounter: 12/04/2014, 9:14 PM  Primary Physician: Tonye PearsonOLITTLE, ROBERT P, MD  Chief Complaint:  Chief Complaint  Patient presents with  . Urinary Tract Infection    lower abd pain, frequency x 1 week.      HPI: 43 y.o. year old female presents with 7 day history of dysuria, urgency, and frequency. Last UTI was years ago No hematuria Has had problems with urinary symptoms with full workup negative, but this feels different.  In the past, xanax helped with urgency. Stay at home mother No sick contacts, recent antibiotics, or recent travels.   No vaginal discharge, back pain, fever  Past Medical History  Diagnosis Date  . Laryngopharyngeal reflux 04/05/2013  . Palpitations 04/05/2013    Cardiac evaluation including echo Holter and EKG were all normal   . 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 Urinary frequency 04/05/2013    [facility]      Home Meds: Prior to Admission medications   Medication Sig Start Date End Date Taking? Authorizing Provider  ALPRAZolam Prudy Feeler(XANAX) 0.5 MG tablet Take 0.5 mg by mouth at bedtime as needed.   Yes Historical Provider, MD  chlorpheniramine (CHLOR-TRIMETON) 4 MG tablet Take 4 mg by mouth 2 (two) times daily as needed for allergies.   Yes Historical Provider, MD  cyclobenzaprine (FLEXERIL) 10 MG tablet Take 1 tablet (10 mg total) by mouth 3 (three) times daily as needed. 05/15/14  Yes Tonye Pearsonobert P Doolittle, MD  ibuprofen (ADVIL,MOTRIN) 200 MG tablet Take 400 mg by mouth at bedtime.   Yes Historical Provider, MD  loratadine (CLARITIN) 10 MG tablet Take 10 mg by mouth daily.   Yes Historical Provider, MD  pantoprazole (PROTONIX) 40 MG tablet Take 1 tablet (40 mg total) by mouth daily. 05/15/14  Yes Tonye Pearsonobert P Doolittle, MD  Multiple Vitamin CAPS Take by mouth 2 (two) times daily.    Historical Provider, MD    Allergies:  Allergies    Allergen Reactions  . Ciprocinonide [Fluocinolone]   . Codeine   . Compazine [Prochlorperazine Edisylate]     History   Social History  . Marital Status: Married    Spouse Name: N/A    Number of Children: N/A  . Years of Education: N/A   Occupational History  . Not on file.   Social History Main Topics  . Smoking status: Former Games developermoker  . Smokeless tobacco: Not on file  . Alcohol Use: Not on file  . Drug Use: Not on file  . Sexual Activity: Not on file   Other Topics Concern  . Not on file   Social History Narrative     Review of Systems: Constitutional: negative for chills, fever, night sweats or weight changes Cardiovascular: negative for chest pain or palpitations Respiratory: negative for hemoptysis, wheezing, or shortness of breath Abdominal: negative for abdominal pain(occasional right lower quadrant "twinge", nausea, vomiting or diarrhea Dermatological: negative for rash Neurologic: negative for headache   Physical Exam: Blood pressure 124/80, pulse 105, temperature 98 F (36.7 C), temperature source Oral, resp. rate 20, height 5' 3.5" (1.613 m), weight 167 lb (75.751 kg), last menstrual period 11/14/2014, SpO2 100 %., Body mass index is 29.12 kg/(m^2). General: Well developed, well nourished, in no acute distress. Head: Normocephalic, atraumatic, eyes without discharge, sclera non-icteric, nares are congested. Bilateral auditory canals clear, TM's are without perforation, pearly grey with reflective cone of light bilaterally. Serous  effusion bilaterally behind TM's. Maxillary sinus TTP. Oral cavity moist, dentition normal. Posterior pharynx with post nasal drip and mild erythema. No peritonsillar abscess or tonsillar exudate. Neck: Supple. No thyromegaly. Full ROM. No lymphadenopathy.  Abdomen: Soft, non-tender, non-distended with normoactive bowel sounds. No hepatosplenomegaly. No rebound/guarding. No obvious abdominal masses. McBurney's, Rovsing's, Iliopsoas,  and table jar all negative. Msk:  Strength and tone normal for age. Extremities: No clubbing or cyanosis. No edema. Neuro: Alert and oriented X 3. Moves all extremities spontaneously. CNII-XII grossly in tact. Psych:  Responds to questions appropriately with a normal affect.   Labs: Results for orders placed or performed in visit on 12/04/14  POCT UA - Microscopic Only  Result Value Ref Range   WBC, Ur, HPF, POC 1-3    RBC, urine, microscopic 0-1    Bacteria, U Microscopic trace    Mucus, UA neg    Epithelial cells, urine per micros 1-3    Crystals, Ur, HPF, POC neg    Casts, Ur, LPF, POC neg    Yeast, UA neg   POCT urinalysis dipstick  Result Value Ref Range   Color, UA yellow    Clarity, UA clear    Glucose, UA neg    Bilirubin, UA neg    Ketones, UA neg    Spec Grav, UA 1.015    Blood, UA neg    pH, UA 7.0    Protein, UA neg    Urobilinogen, UA 0.2    Nitrite, UA neg    Leukocytes, UA Negative       ASSESSMENT AND PLAN:  43 y.o. year old female with  -   ICD-9-CM ICD-10-CM   1. Dysuria 788.1 R30.0 POCT UA - Microscopic Only     POCT urinalysis dipstick     Urine culture   -Tylenol/Motrin prn -Rest/fluids -RTC precautions -RTC 3-5 days if no improvement  Signed, Elvina SidleKurt Alliyah Roesler, MD 12/04/2014 9:14 PM

## 2014-12-06 LAB — URINE CULTURE
Colony Count: NO GROWTH
Organism ID, Bacteria: NO GROWTH

## 2015-01-29 ENCOUNTER — Other Ambulatory Visit: Payer: Self-pay | Admitting: Internal Medicine

## 2015-05-29 ENCOUNTER — Other Ambulatory Visit: Payer: Self-pay | Admitting: Internal Medicine

## 2015-09-09 ENCOUNTER — Ambulatory Visit (INDEPENDENT_AMBULATORY_CARE_PROVIDER_SITE_OTHER): Payer: PRIVATE HEALTH INSURANCE | Admitting: Physician Assistant

## 2015-09-09 VITALS — BP 124/80 | HR 107 | Temp 98.6°F | Resp 16 | Ht 64.0 in | Wt 172.0 lb

## 2015-09-09 DIAGNOSIS — H9202 Otalgia, left ear: Secondary | ICD-10-CM | POA: Diagnosis not present

## 2015-09-09 DIAGNOSIS — M2669 Other specified disorders of temporomandibular joint: Secondary | ICD-10-CM

## 2015-09-09 DIAGNOSIS — M26629 Arthralgia of temporomandibular joint, unspecified side: Secondary | ICD-10-CM

## 2015-09-09 DIAGNOSIS — R0981 Nasal congestion: Secondary | ICD-10-CM

## 2015-09-09 MED ORDER — CYCLOBENZAPRINE HCL 10 MG PO TABS: 10.0000 mg | ORAL_TABLET | Freq: Every day | ORAL | Status: DC

## 2015-09-09 MED ORDER — IPRATROPIUM BROMIDE 0.03 % NA SOLN: 2.0000 | Freq: Two times a day (BID) | NASAL | Status: DC

## 2015-09-09 NOTE — Patient Instructions (Signed)
Please continue to use the mucinex and ibuprofen/tylenol.     Upper Respiratory Infection, Adult An upper respiratory infection (URI) is also sometimes known as the common cold. The upper respiratory tract includes the nose, sinuses, throat, trachea, and bronchi. Bronchi are the airways leading to the lungs. Most people improve within 1 week, but symptoms can last up to 2 weeks. A residual cough may last even longer.  CAUSES Many different viruses can infect the tissues lining the upper respiratory tract. The tissues become irritated and inflamed and often become very moist. Mucus production is also common. A cold is contagious. You can easily spread the virus to others by oral contact. This includes kissing, sharing a glass, coughing, or sneezing. Touching your mouth or nose and then touching a surface, which is then touched by another person, can also spread the virus. SYMPTOMS  Symptoms typically develop 1 to 3 days after you come in contact with a cold virus. Symptoms vary from person to person. They may include:  Runny nose.  Sneezing.  Nasal congestion.  Sinus irritation.  Sore throat.  Loss of voice (laryngitis).  Cough.  Fatigue.  Muscle aches.  Loss of appetite.  Headache.  Low-grade fever. DIAGNOSIS  You might diagnose your own cold based on familiar symptoms, since most people get a cold 2 to 3 times a year. Your caregiver can confirm this based on your exam. Most importantly, your caregiver can check that your symptoms are not due to another disease such as strep throat, sinusitis, pneumonia, asthma, or epiglottitis. Blood tests, throat tests, and X-rays are not necessary to diagnose a common cold, but they may sometimes be helpful in excluding other more serious diseases. Your caregiver will decide if any further tests are required. RISKS AND COMPLICATIONS  You may be at risk for a more severe case of the common cold if you smoke cigarettes, have chronic heart disease  (such as heart failure) or lung disease (such as asthma), or if you have a weakened immune system. The very young and very old are also at risk for more serious infections. Bacterial sinusitis, middle ear infections, and bacterial pneumonia can complicate the common cold. The common cold can worsen asthma and chronic obstructive pulmonary disease (COPD). Sometimes, these complications can require emergency medical care and may be life-threatening. PREVENTION  The best way to protect against getting a cold is to practice good hygiene. Avoid oral or hand contact with people with cold symptoms. Wash your hands often if contact occurs. There is no clear evidence that vitamin C, vitamin E, echinacea, or exercise reduces the chance of developing a cold. However, it is always recommended to get plenty of rest and practice good nutrition. TREATMENT  Treatment is directed at relieving symptoms. There is no cure. Antibiotics are not effective, because the infection is caused by a virus, not by bacteria. Treatment may include:  Increased fluid intake. Sports drinks offer valuable electrolytes, sugars, and fluids.  Breathing heated mist or steam (vaporizer or shower).  Eating chicken soup or other clear broths, and maintaining good nutrition.  Getting plenty of rest.  Using gargles or lozenges for comfort.  Controlling fevers with ibuprofen or acetaminophen as directed by your caregiver.  Increasing usage of your inhaler if you have asthma. Zinc gel and zinc lozenges, taken in the first 24 hours of the common cold, can shorten the duration and lessen the severity of symptoms. Pain medicines may help with fever, muscle aches, and throat pain. A variety of  non-prescription medicines are available to treat congestion and runny nose. Your caregiver can make recommendations and may suggest nasal or lung inhalers for other symptoms.  HOME CARE INSTRUCTIONS   Only take over-the-counter or prescription medicines  for pain, discomfort, or fever as directed by your caregiver.  Use a warm mist humidifier or inhale steam from a shower to increase air moisture. This may keep secretions moist and make it easier to breathe.  Drink enough water and fluids to keep your urine clear or pale yellow.  Rest as needed.  Return to work when your temperature has returned to normal or as your caregiver advises. You may need to stay home longer to avoid infecting others. You can also use a face mask and careful hand washing to prevent spread of the virus. SEEK MEDICAL CARE IF:   After the first few days, you feel you are getting worse rather than better.  You need your caregiver's advice about medicines to control symptoms.  You develop chills, worsening shortness of breath, or brown or red sputum. These may be signs of pneumonia.  You develop yellow or brown nasal discharge or pain in the face, especially when you bend forward. These may be signs of sinusitis.  You develop a fever, swollen neck glands, pain with swallowing, or white areas in the back of your throat. These may be signs of strep throat. SEEK IMMEDIATE MEDICAL CARE IF:   You have a fever.  You develop severe or persistent headache, ear pain, sinus pain, or chest pain.  You develop wheezing, a prolonged cough, cough up blood, or have a change in your usual mucus (if you have chronic lung disease).  You develop sore muscles or a stiff neck. Document Released: 06/01/2001 Document Revised: 02/28/2012 Document Reviewed: 03/13/2014 [facility] Patient Information 2015 [place], [place]. This information is not intended to replace advice given to you by your health care provider. Make sure you discuss any questions you have with your health care provider.

## 2015-09-11 NOTE — Progress Notes (Signed)
[facility] [address] [place] [address] [phone]  Date:  09/09/2015   Name:  Emily Hayden   DOB:  [birth year]   MRN:  [id number]  PCP:  Tonye Pearson, MD    History of Present Illness:  Emily Hayden is a 44 y.o. female patient with PMH of TMJ pain dysfxn syndrome who presents to [facility] for chief complaint of ear pain <left side that started today.  Patient has had a cold for about 1 week that is resolving.  Sxs included sorethroat and nasal congestion.  She states that it is a quick sharp pain and feels as if it is moving for seconds.  She has no drainage, hearing loss, or tinnitus.  She denies fever but initially with her sxs.   She has a hx of TMJ which she uses flexeril and  of advil nightly.  Her TMJ was initially discovered with the sensation of an ear ache, though she claims this is a different pain.  Her nasal congestion is clearing at this time.       Patient Active Problem List   Diagnosis Date Noted  . 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 Urinary frequency 04/05/2013  . Palpitations 04/05/2013  . Laryngopharyngeal reflux 04/05/2013    Past Medical History  Diagnosis Date  . Laryngopharyngeal reflux 04/05/2013  . Palpitations 04/05/2013    Cardiac evaluation including echo Holter and EKG were all normal   . 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 Urinary frequency 04/05/2013    Evaluated at [facility] with negative findings     Past Surgical History  Procedure Laterality Date  . Cesarean section      Social History  Substance Use Topics  . Smoking status: Former Games developer  . Smokeless tobacco: None  . Alcohol Use: None    Family History  Problem Relation Age of Onset  . Heart attack Maternal Grandfather   . Heart attack Maternal Grandmother   . Heart attack Paternal Grandfather   . Heart attack Paternal Grandmother   . Angina      Prinzmetel's Variant Angina  . Colon cancer Father     Allergies   Allergen Reactions  . Ciprocinonide [Fluocinolone]   . Codeine   . Compazine [Prochlorperazine Edisylate]     Medication list has been reviewed and updated.  Current Outpatient Prescriptions on File Prior to Visit  Medication Sig Dispense Refill  . ALPRAZolam (XANAX) 0.5 MG tablet Take 0.5 mg by mouth at bedtime as needed.    . chlorpheniramine (CHLOR-TRIMETON) 4 MG tablet Take 4 mg by mouth 2 (two) times daily as needed for allergies.    Marland Kitchen ibuprofen (ADVIL,MOTRIN) 200 MG tablet Take 400 mg by mouth at bedtime.    Marland Kitchen loratadine (CLARITIN) 10 MG tablet Take 10 mg by mouth daily.    . Multiple Vitamin CAPS Take by mouth 2 (two) times daily.    . pantoprazole (PROTONIX) 40 MG tablet Take 1 tablet (40 mg total) by mouth daily. PATIENT NEEDS OFFICE VISIT FOR ADDITIONAL REFILLS (Patient not taking: Reported on 09/09/2015) 30 tablet 0  . sulfamethoxazole-trimethoprim (BACTRIM) 400-80 MG per tablet Take 1 tablet by mouth 2 (two) times daily. (Patient not taking: Reported on 09/09/2015) 6 tablet 0   No current facility-administered medications on file prior to visit.    ROS ROS otherwise unremarkable unless listed above.   Physical Examination: BP 124/80 mmHg  Pulse 107  Temp(Src) 98.6 F (37 C) (Oral)  Resp 16  Ht  (1.626 m)  Wt 172 lb (78.019 kg)  BMI 29.51 kg/m2  SpO2 98%  LMP 08/05/2015 Ideal Body Weight: Weight in (lb) to have BMI = 25: 145.3  Physical Exam  Constitutional: She is oriented to person, place, and time. She appears well-developed and well-nourished. No distress.  HENT:  Head: Normocephalic and atraumatic.  Right Ear: Tympanic membrane, external ear and ear canal normal.  Left Ear: Tympanic membrane, external ear and ear canal normal. Tympanic membrane is not injected, not perforated and not bulging.  No middle ear effusion.  Nose: Mucosal edema and rhinorrhea (Mild) present. Right sinus exhibits no maxillary sinus tenderness and no frontal sinus tenderness.  Left sinus exhibits no maxillary sinus tenderness and no frontal sinus tenderness.  Mouth/Throat: No uvula swelling. No oropharyngeal exudate, posterior oropharyngeal edema or posterior oropharyngeal erythema.  Eyes: Conjunctivae and EOM are normal. Pupils are equal, round, and reactive to light.  Cardiovascular: Normal rate and regular rhythm.  Exam reveals no gallop, no distant heart sounds and no friction rub.   No murmur heard. Pulmonary/Chest: Effort normal. No respiratory distress. She has no decreased breath sounds. She has no wheezes. She has no rhonchi.  Lymphadenopathy:       Head (right side): No submandibular, no tonsillar, no preauricular and no posterior auricular adenopathy present.       Head (left side): No submandibular, no tonsillar, no preauricular and no posterior auricular adenopathy present.  Neurological: She is alert and oriented to person, place, and time.  Skin: She is not diaphoretic.  Psychiatric: She has a normal mood and affect. Her behavior is normal.    Assessment and Plan: 44 year old female is here today for chief complaint of otalgia.   -I am advising we start ipratropium to clear this mucus.  This does not look like infection at this time.  Possible eustachian tube dysfxn. Also advising flexeril and to use three times per day as needed, to see if this helps resolve some of the pain, if this is referred pain from TMJ.  She will contact in 3 days if no improvement.  May consider prednisone to see if there is possibly inflammation at play, prior to referral to ENT.  -Will not recommend sudafed at this time due to tachycardia, though appears workup has cleared with multiple two holden monitors.     Otalgia, left  Nasal congestion - Plan: ipratropium (ATROVENT) 0.03 % nasal spray  TMJ syndrome - Plan: cyclobenzaprine (FLEXERIL) 10 MG tablet   Trena Platt, PA-C [facility] 09/11/2015 9:38 AM

## 2015-09-19 IMAGING — CR DG TOE 3RD 2+V*L*
1 series · 1 of 1 positions shown · non-contrast
Comparison: 07/19/2014

CLINICAL DATA: Fractured left toe.  Follow-up exam.

EXAM:
LEFT THIRD TOE

[AP]
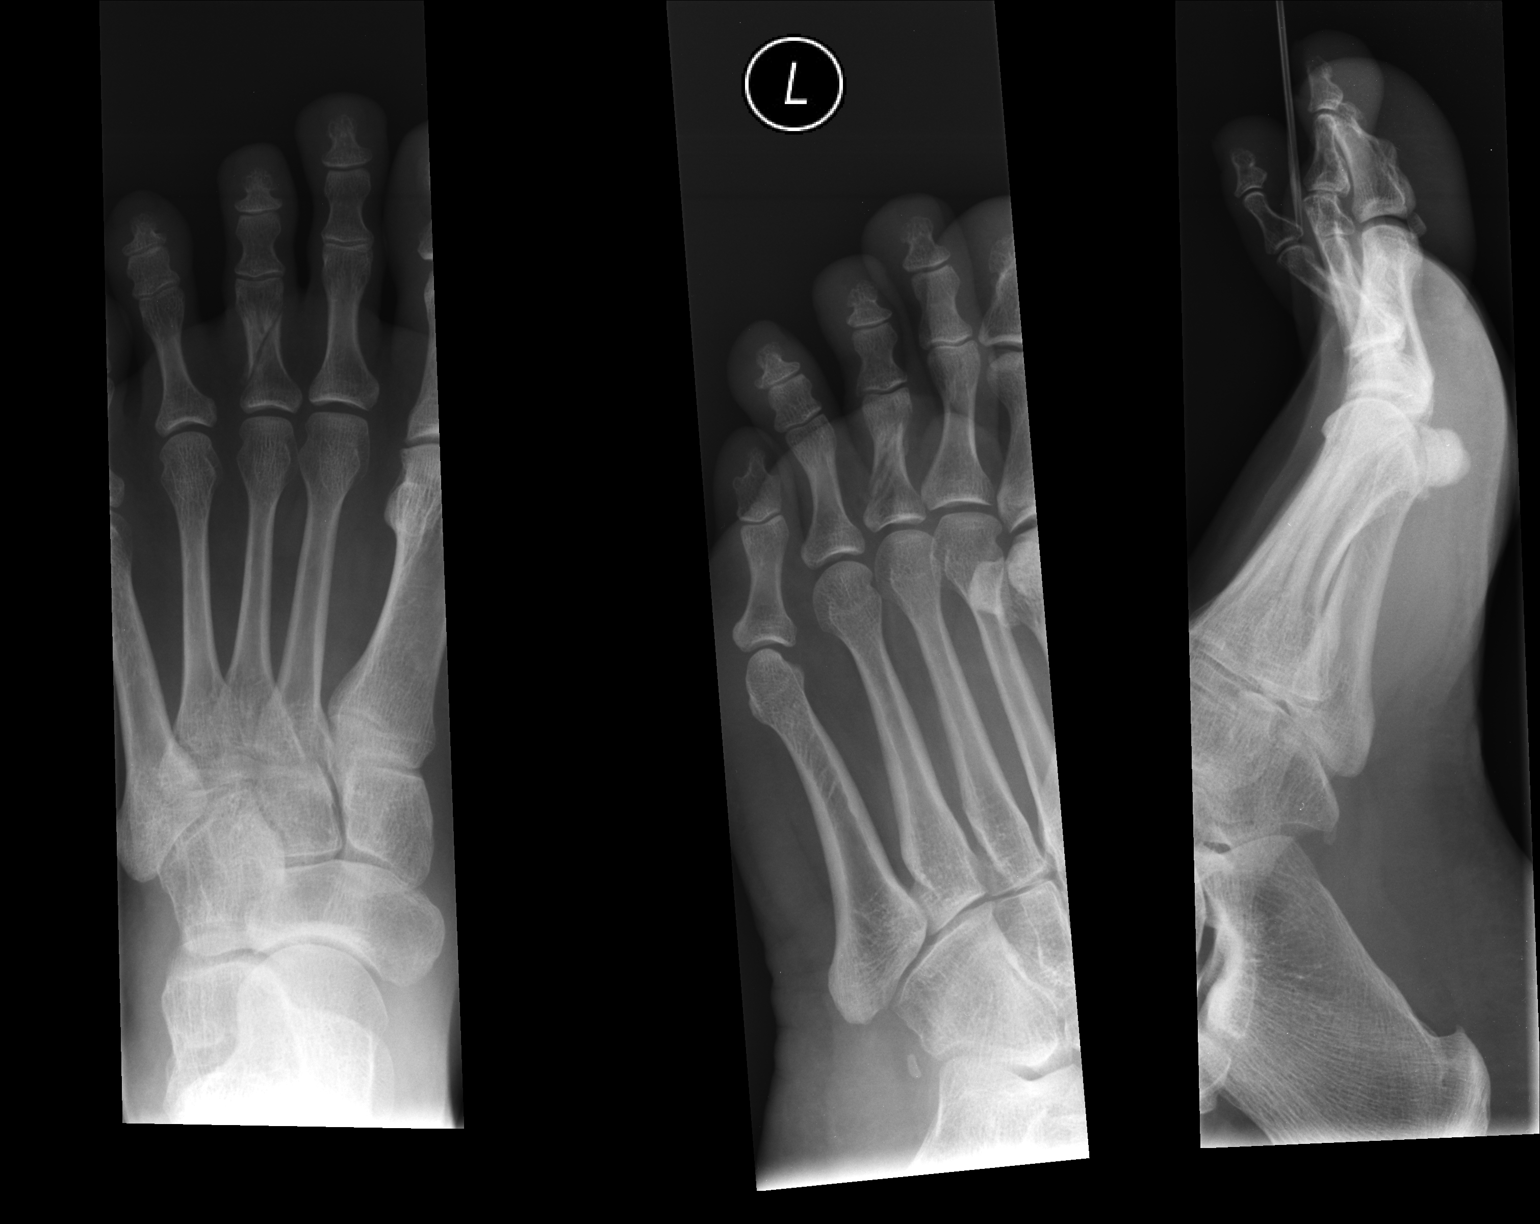

[1 of 1 positions shown; findings below may reference images not displayed]

FINDINGS: Re- demonstrated oblique extra-articular fracture through the third
proximal phalanx. There is new periosteal reaction consistent with
early healing. The fracture line remains readily visible. No
interval displacement.
IMPRESSION: Healing third proximal phalanx fracture. The fracture remains
readily visible; no displacement.

## 2015-11-17 ENCOUNTER — Encounter: Payer: Self-pay | Admitting: Internal Medicine

## 2016-04-19 ENCOUNTER — Ambulatory Visit (INDEPENDENT_AMBULATORY_CARE_PROVIDER_SITE_OTHER): Payer: 59 | Admitting: Physician Assistant

## 2016-04-19 VITALS — BP 120/62 | HR 108 | Temp 97.8°F | Resp 16 | Ht 63.0 in | Wt 170.6 lb

## 2016-04-19 DIAGNOSIS — R000 Tachycardia, unspecified: Secondary | ICD-10-CM

## 2016-04-19 DIAGNOSIS — N760 Acute vaginitis: Secondary | ICD-10-CM

## 2016-04-19 DIAGNOSIS — Z124 Encounter for screening for malignant neoplasm of cervix: Secondary | ICD-10-CM | POA: Diagnosis not present

## 2016-04-19 DIAGNOSIS — B9689 Other specified bacterial agents as the cause of diseases classified elsewhere: Secondary | ICD-10-CM

## 2016-04-19 DIAGNOSIS — M26629 Arthralgia of temporomandibular joint, unspecified side: Secondary | ICD-10-CM

## 2016-04-19 DIAGNOSIS — R3915 Urgency of urination: Secondary | ICD-10-CM

## 2016-04-19 DIAGNOSIS — L299 Pruritus, unspecified: Secondary | ICD-10-CM | POA: Diagnosis not present

## 2016-04-19 DIAGNOSIS — A499 Bacterial infection, unspecified: Secondary | ICD-10-CM

## 2016-04-19 LAB — POCT URINALYSIS DIP (MANUAL ENTRY)
Bilirubin, UA: NEGATIVE
Glucose, UA: NEGATIVE
Ketones, POC UA: NEGATIVE
LEUKOCYTES UA: NEGATIVE
Nitrite, UA: NEGATIVE
PROTEIN UA: NEGATIVE
Spec Grav, UA: 1.015
UROBILINOGEN UA: 0.2
pH, UA: 7

## 2016-04-19 LAB — POC MICROSCOPIC URINALYSIS (UMFC): MUCUS RE: ABSENT

## 2016-04-19 LAB — POCT WET + KOH PREP
Trich by wet prep: ABSENT
Yeast by KOH: ABSENT
Yeast by wet prep: ABSENT

## 2016-04-19 MED ORDER — METRONIDAZOLE 500 MG PO TABS: 500.0000 mg | ORAL_TABLET | Freq: Two times a day (BID) | ORAL | Status: DC

## 2016-04-19 MED ORDER — HYDROXYZINE HCL 25 MG PO TABS: 25.0000 mg | ORAL_TABLET | Freq: Three times a day (TID) | ORAL | Status: AC | PRN

## 2016-04-19 MED ORDER — CYCLOBENZAPRINE HCL 10 MG PO TABS: 10.0000 mg | ORAL_TABLET | Freq: Every day | ORAL | Status: DC

## 2016-04-19 NOTE — Patient Instructions (Addendum)
     IF you received an x-ray today, you will receive an invoice from [facility]. Please contact [facility] at [phone] with questions or concerns regarding your invoice.   IF you received labwork today, you will receive an invoice from [facility]. Please contact [facility] at [phone] with questions or concerns regarding your invoice.   Our billing staff will not be able to assist you with questions regarding bills from these companies.  You will be contacted with the lab results as soon as they are available. The fastest way to get your results is to activate your My Chart account. Instructions are located on the last page of this paperwork. If you have not heard from us regarding the results in 2 weeks, please contact this office.    Please use the metronidazole as prescribed. I would like you to try the hydroxyzine at night only.  It is sedating, so careful. I am sending a referral so please await contact for urology visit.   I will have your lab results within the next 7 days.

## 2016-04-20 LAB — TSH: TSH: 1.6 m[IU]/L

## 2016-04-20 LAB — PAP IG, CT-NG, RFX HPV ASCU
CHLAMYDIA PROBE AMP: NOT DETECTED
GC Probe Amp: NOT DETECTED

## 2016-04-24 NOTE — Progress Notes (Signed)
[facility] [address], [place] [address] [phone]  Date:  04/19/2016   Name:  Emily Hayden   DOB:  [birth year]   MRN:  [id number]  PCP:  Tonye Pearson, MD   Chief Complaint  Patient presents with  . Urinary Urgency    with itching and burning x on/off for a few weeks  . Medication Refill    flexeril    History of Present Illness:  Emily Hayden is a 45 y.o. female patient who presents to [facility] for cc of urinary urgency intermittent for about 2 weeks.  She thought it was going away but then she developed ithching and burning.  She has no nausea, abdominal pain, fever, or back pain.  No constipation.  She has no diarrhea.  She has had this prior, which was worked up by urology.  From her reports, this is unlikely that she had a cystoscopy.     She would also like a refill of the flexeril for her tmj.  This has been helpful with her symptoms, ridding her of jaw pain.  She does not need it daily.     Patient Active Problem List   Diagnosis Date Noted  . 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 Urinary frequency 04/05/2013  . Palpitations 04/05/2013  . Laryngopharyngeal reflux 04/05/2013    Past Medical History  Diagnosis Date  . Laryngopharyngeal reflux 04/05/2013  . Palpitations 04/05/2013    Cardiac evaluation including echo Holter and EKG were all normal   . 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 Urinary frequency 04/05/2013    Evaluated at [facility] with negative findings     Past Surgical History  Procedure Laterality Date  . Cesarean section      Social History  Substance Use Topics  . Smoking status: Former Games developer  . Smokeless tobacco: None  . Alcohol Use: None    Family History  Problem Relation Age of Onset  . Heart attack Maternal Grandfather   . Heart attack Maternal Grandmother   . Heart attack Paternal Grandfather   . Heart attack Paternal Grandmother   . Angina      Prinzmetel's Variant Angina  .  Colon cancer Father     Allergies  Allergen Reactions  . Ciprocinonide [Fluocinolone]   . Codeine   . Compazine [Prochlorperazine Edisylate]     Medication list has been reviewed and updated.  Current Outpatient Prescriptions on File Prior to Visit  Medication Sig Dispense Refill  . ALPRAZolam (XANAX) 0.5 MG tablet Take 0.5 mg by mouth at bedtime as needed.    . chlorpheniramine (CHLOR-TRIMETON) 4 MG tablet Take 4 mg by mouth 2 (two) times daily as needed for allergies.    Marland Kitchen ibuprofen (ADVIL,MOTRIN) 200 MG tablet Take 400 mg by mouth at bedtime.    Marland Kitchen ipratropium (ATROVENT) 0.03 % nasal spray Place 2 sprays into both nostrils 2 (two) times daily. (Patient not taking: Reported on 04/19/2016) 30 mL 0  . loratadine (CLARITIN) 10 MG tablet Take 10 mg by mouth daily. Reported on 04/19/2016    . Multiple Vitamin CAPS Take by mouth 2 (two) times daily. Reported on 04/19/2016    . pantoprazole (PROTONIX) 40 MG tablet Take 1 tablet (40 mg total) by mouth daily. PATIENT NEEDS OFFICE VISIT FOR ADDITIONAL REFILLS (Patient not taking: Reported on 09/09/2015) 30 tablet 0  . sulfamethoxazole-trimethoprim (BACTRIM) 400-80 MG per tablet Take 1 tablet by mouth 2 (two) times daily. (Patient not  taking: Reported on 09/09/2015) 6 tablet 0   No current facility-administered medications on file prior to visit.    ROS ROS otherwise unremarkable unless listed above.   Physical Examination: BP 120/62 mmHg  Pulse 108  Temp(Src) 97.8 F (36.6 C) (Oral)  Resp 16  Ht 5\' 3"  (1.6 m)  Wt 170 lb 9.6 oz (77.384 kg)  BMI 30.23 kg/m2  SpO2 100%  LMP 04/12/2016 Ideal Body Weight: Weight in (lb) to have BMI = 25: 140.8  Physical Exam  Constitutional: She is oriented to person, place, and time. She appears well-developed and well-nourished. No distress.  HENT:  Head: Normocephalic and atraumatic.  Right Ear: External ear normal.  Left Ear: External ear normal.  Eyes: Conjunctivae and EOM are normal. Pupils are equal,  round, and reactive to light.  Cardiovascular: Normal rate.   Pulmonary/Chest: Effort normal. No respiratory distress. She has no decreased breath sounds. She has no wheezes.  Genitourinary: Pelvic exam was performed with patient supine. There is no rash on the right labia. There is no rash on the left labia. Cervix exhibits no motion tenderness, no discharge and no friability. Right adnexum displays no mass, no tenderness and no fullness. Left adnexum displays no mass, no tenderness and no fullness. Vaginal discharge (white non-odorous nor voluminous) found.  Neurological: She is alert and oriented to person, place, and time.  Skin: She is not diaphoretic.  Psychiatric: She has a normal mood and affect. Her behavior is normal.   Results for orders placed or performed in visit on 04/19/16  TSH  Result Value Ref Range   TSH 1.60 mIU/L  POCT urinalysis dipstick  Result Value Ref Range   Color, UA yellow yellow   Clarity, UA clear clear   Glucose, UA negative negative   Bilirubin, UA negative negative   Ketones, POC UA negative negative   Spec Grav, UA 1.015    Blood, UA trace-intact (A) negative   pH, UA 7.0    Protein Ur, POC negative negative   Urobilinogen, UA 0.2    Nitrite, UA Negative Negative   Leukocytes, UA Negative Negative  POCT Microscopic Urinalysis ([facility])  Result Value Ref Range   WBC,UR,HPF,POC None None WBC/hpf   RBC,UR,HPF,POC None None RBC/hpf   Bacteria None None, Too numerous to count   Mucus Absent Absent   Epithelial Cells, UR Per Microscopy Few (A) None, Too numerous to count cells/hpf  POCT Wet + KOH Prep  Result Value Ref Range   Yeast by KOH Absent Present, Absent   Yeast by wet prep Absent Present, Absent   WBC by wet prep Few None, Few, Too numerous to count   Clue Cells Wet Prep HPF POC Few (A) None, Too numerous to count   Trich by wet prep Absent Present, Absent   Bacteria Wet Prep HPF POC Few None, Few, Too numerous to count   Epithelial Cells By  [employer] Pref ([facility]) Moderate (A) None, Few, Too numerous to count   RBC,UR,HPF,POC None None RBC/hpf  Pap IG, CT/NG w/ reflex HPV when ASC-U  Result Value Ref Range   Specimen adequacy: SEE NOTE    FINAL DIAGNOSIS: SEE NOTE    COMMENTS: SEE NOTE    Cytotechnologist: SEE NOTE    Chlamydia Probe Amp NOT DETECTED    GC Probe Amp NOT DETECTED      Assessment and Plan: Pearlee Arvizu is a 44 y.o. female who is here today for urinary urgency. --we will treat for possible BV.  I have advised that she start the hydroxyzine after 3-5 days of the BV.  It is possible that this is symptoms of another cystitis, and would need further work up. Urinary urgency - Plan: POCT urinalysis dipstick, POCT Microscopic Urinalysis ([facility]), POCT Wet + KOH Prep, Pap IG, CT/NG w/ reflex HPV when ASC-U, Ambulatory referral to Urology  Screening for cervical cancer - Plan: Pap IG, CT/NG w/ reflex HPV when ASC-U  BV (bacterial vaginosis) - Plan: metroNIDAZOLE (FLAGYL) 500 MG tablet  Pruritus - Plan: hydrOXYzine (ATARAX/VISTARIL) 25 MG tablet  Tachycardia - Plan: TSH  TMJ syndrome - Plan: cyclobenzaprine (FLEXERIL) 10 MG tablet  Trena PlattStephanie Shakeira Rhee, PA-C [facility] 04/24/2016 10:18 PM

## 2016-10-06 ENCOUNTER — Other Ambulatory Visit: Payer: Self-pay

## 2016-10-06 DIAGNOSIS — M26629 Arthralgia of temporomandibular joint, unspecified side: Secondary | ICD-10-CM

## 2016-10-06 NOTE — Telephone Encounter (Signed)
Last ov and refill 04/2016

## 2016-10-07 MED ORDER — CYCLOBENZAPRINE HCL 10 MG PO TABS: 10.0000 mg | ORAL_TABLET | Freq: Every day | ORAL | 0 refills | Status: AC

## 2016-12-05 ENCOUNTER — Other Ambulatory Visit: Payer: Self-pay | Admitting: Physician Assistant

## 2016-12-05 DIAGNOSIS — M26629 Arthralgia of temporomandibular joint, unspecified side: Secondary | ICD-10-CM

## 2018-12-16 ENCOUNTER — Emergency Department (HOSPITAL_COMMUNITY): Payer: 59

## 2018-12-16 ENCOUNTER — Encounter (HOSPITAL_COMMUNITY): Payer: Self-pay | Admitting: Nurse Practitioner

## 2018-12-16 ENCOUNTER — Inpatient Hospital Stay (HOSPITAL_COMMUNITY)
Admission: EM | Admit: 2018-12-16 | Discharge: 2018-12-22 | DRG: 339 | Disposition: A | Discharge status: Home / Self Care | Payer: 59 | Attending: General Surgery | Admitting: General Surgery

## 2018-12-16 DIAGNOSIS — K3580 Unspecified acute appendicitis: Secondary | ICD-10-CM | POA: Diagnosis present

## 2018-12-16 DIAGNOSIS — R1031 Right lower quadrant pain: Secondary | ICD-10-CM

## 2018-12-16 DIAGNOSIS — K381 Appendicular concretions: Secondary | ICD-10-CM | POA: Diagnosis present

## 2018-12-16 DIAGNOSIS — Z885 Allergy status to narcotic agent status: Secondary | ICD-10-CM

## 2018-12-16 DIAGNOSIS — Z888 Allergy status to other drugs, medicaments and biological substances status: Secondary | ICD-10-CM

## 2018-12-16 DIAGNOSIS — K567 Ileus, unspecified: Secondary | ICD-10-CM | POA: Diagnosis not present

## 2018-12-16 DIAGNOSIS — K3531 Acute appendicitis with localized peritonitis and gangrene, without perforation: Secondary | ICD-10-CM | POA: Diagnosis not present

## 2018-12-16 DIAGNOSIS — Z87891 Personal history of nicotine dependence: Secondary | ICD-10-CM

## 2018-12-16 DIAGNOSIS — K3532 Acute appendicitis with perforation and localized peritonitis, without abscess: Principal | ICD-10-CM | POA: Diagnosis present

## 2018-12-16 DIAGNOSIS — K219 Gastro-esophageal reflux disease without esophagitis: Secondary | ICD-10-CM | POA: Diagnosis present

## 2018-12-16 DIAGNOSIS — K3589 Other acute appendicitis without perforation or gangrene: Secondary | ICD-10-CM

## 2018-12-16 LAB — CBC
HCT: 39.2 % (ref 36.0–46.0)
HEMOGLOBIN: 12.8 g/dL (ref 12.0–15.0)
MCH: 30.5 pg (ref 26.0–34.0)
MCHC: 32.7 g/dL (ref 30.0–36.0)
MCV: 93.6 fL (ref 80.0–100.0)
Platelets: 412 10*3/uL — ABNORMAL HIGH (ref 150–400)
RBC: 4.19 MIL/uL (ref 3.87–5.11)
RDW: 13 % (ref 11.5–15.5)
WBC: 12.5 10*3/uL — ABNORMAL HIGH (ref 4.0–10.5)
nRBC: 0 % (ref 0.0–0.2)

## 2018-12-16 LAB — COMPREHENSIVE METABOLIC PANEL
ALT: 18 U/L (ref 0–44)
AST: 24 U/L (ref 15–41)
Albumin: 4.5 g/dL (ref 3.5–5.0)
Alkaline Phosphatase: 49 U/L (ref 38–126)
Anion gap: 11 (ref 5–15)
BUN: 18 mg/dL (ref 6–20)
CO2: 23 mmol/L (ref 22–32)
Calcium: 9.3 mg/dL (ref 8.9–10.3)
Chloride: 104 mmol/L (ref 98–111)
Creatinine, Ser: 0.71 mg/dL (ref 0.44–1.00)
GFR calc Af Amer: >60 mL/min (ref >60)
GFR calc non Af Amer: >60 mL/min (ref >60)
Glucose, Bld: 133 mg/dL — ABNORMAL HIGH (ref 70–99)
POTASSIUM: 3.5 mmol/L (ref 3.5–5.1)
Sodium: 138 mmol/L (ref 135–145)
Total Bilirubin: 0.5 mg/dL (ref 0.3–1.2)
Total Protein: 7.3 g/dL (ref 6.5–8.1)

## 2018-12-16 LAB — URINALYSIS, ROUTINE W REFLEX MICROSCOPIC
Bilirubin Urine: NEGATIVE
Glucose, UA: NEGATIVE mg/dL
Hgb urine dipstick: NEGATIVE
Ketones, ur: 80 mg/dL — AB
Leukocytes, UA: NEGATIVE
Nitrite: NEGATIVE
Protein, ur: NEGATIVE mg/dL
Specific Gravity, Urine: 1.015 (ref 1.005–1.030)
pH: 9 — ABNORMAL HIGH (ref 5.0–8.0)

## 2018-12-16 LAB — I-STAT BETA HCG BLOOD, ED (MC, WL, AP ONLY): I-stat hCG, quantitative: <5 m[IU]/mL (ref <5)

## 2018-12-16 LAB — LIPASE, BLOOD: Lipase: 36 U/L (ref 11–51)

## 2018-12-16 MED ORDER — SODIUM CHLORIDE (PF) 0.9 % IJ SOLN
INTRAMUSCULAR | Status: AC
  Filled 2018-12-16: qty 50

## 2018-12-16 MED ORDER — PROMETHAZINE HCL 25 MG/ML IJ SOLN
25.0000 mg | Freq: Once | INTRAMUSCULAR | Status: AC
  Administered 2018-12-16: 25 mg via INTRAVENOUS
  Filled 2018-12-16: qty 1

## 2018-12-16 MED ORDER — FENTANYL CITRATE (PF) 100 MCG/2ML IJ SOLN
50.0000 ug | Freq: Once | INTRAMUSCULAR | Status: AC
  Administered 2018-12-16: 50 ug via INTRAVENOUS
  Filled 2018-12-16: qty 2

## 2018-12-16 MED ORDER — SODIUM CHLORIDE 0.9 % IV BOLUS
1000.0000 mL | Freq: Once | INTRAVENOUS | Status: AC
  Administered 2018-12-16: 1000 mL via INTRAVENOUS

## 2018-12-16 MED ORDER — IOPAMIDOL (ISOVUE-300) INJECTION 61%
INTRAVENOUS | Status: AC
  Filled 2018-12-16: qty 100

## 2018-12-16 MED ORDER — ONDANSETRON HCL 4 MG/2ML IJ SOLN
4.0000 mg | Freq: Once | INTRAMUSCULAR | Status: AC
  Administered 2018-12-16: 4 mg via INTRAVENOUS
  Filled 2018-12-16: qty 2

## 2018-12-16 MED ORDER — MORPHINE SULFATE (PF) 4 MG/ML IV SOLN
4.0000 mg | Freq: Once | INTRAVENOUS | Status: AC
  Administered 2018-12-16: 4 mg via INTRAVENOUS
  Filled 2018-12-16: qty 1

## 2018-12-16 MED ORDER — IOPAMIDOL (ISOVUE-300) INJECTION 61%
100.0000 mL | Freq: Once | INTRAVENOUS | Status: AC | PRN
  Administered 2018-12-16: 100 mL via INTRAVENOUS

## 2018-12-16 NOTE — ED Triage Notes (Signed)
Pt presents in pain induced distress guarding her lower mid abdominal pain and c/o sudden onset severe pain. Denies any GI hx or any medical hx.

## 2018-12-17 ENCOUNTER — Encounter (HOSPITAL_COMMUNITY): Admission: EM | Disposition: A | Discharge status: Home / Self Care | Payer: Self-pay | Source: Home / Self Care

## 2018-12-17 ENCOUNTER — Inpatient Hospital Stay (HOSPITAL_COMMUNITY): Payer: 59 | Admitting: Certified Registered"

## 2018-12-17 ENCOUNTER — Other Ambulatory Visit: Payer: Self-pay

## 2018-12-17 ENCOUNTER — Encounter (HOSPITAL_COMMUNITY): Payer: Self-pay | Admitting: *Deleted

## 2018-12-17 DIAGNOSIS — K381 Appendicular concretions: Secondary | ICD-10-CM | POA: Diagnosis present

## 2018-12-17 DIAGNOSIS — K3531 Acute appendicitis with localized peritonitis and gangrene, without perforation: Secondary | ICD-10-CM | POA: Diagnosis present

## 2018-12-17 DIAGNOSIS — Z888 Allergy status to other drugs, medicaments and biological substances status: Secondary | ICD-10-CM | POA: Diagnosis not present

## 2018-12-17 DIAGNOSIS — K567 Ileus, unspecified: Secondary | ICD-10-CM | POA: Diagnosis not present

## 2018-12-17 DIAGNOSIS — K219 Gastro-esophageal reflux disease without esophagitis: Secondary | ICD-10-CM | POA: Diagnosis present

## 2018-12-17 DIAGNOSIS — Z87891 Personal history of nicotine dependence: Secondary | ICD-10-CM | POA: Diagnosis not present

## 2018-12-17 DIAGNOSIS — K3532 Acute appendicitis with perforation and localized peritonitis, without abscess: Secondary | ICD-10-CM | POA: Diagnosis present

## 2018-12-17 DIAGNOSIS — Z885 Allergy status to narcotic agent status: Secondary | ICD-10-CM | POA: Diagnosis not present

## 2018-12-17 DIAGNOSIS — K3580 Unspecified acute appendicitis: Secondary | ICD-10-CM | POA: Diagnosis present

## 2018-12-17 HISTORY — PX: LAPAROSCOPIC APPENDECTOMY: SHX408

## 2018-12-17 LAB — MAGNESIUM: Magnesium: 1.8 mg/dL (ref 1.7–2.4)

## 2018-12-17 LAB — CBC
HEMATOCRIT: 36.1 % (ref 36.0–46.0)
Hemoglobin: 11.8 g/dL — ABNORMAL LOW (ref 12.0–15.0)
MCH: 31.9 pg (ref 26.0–34.0)
MCHC: 32.7 g/dL (ref 30.0–36.0)
MCV: 97.6 fL (ref 80.0–100.0)
Platelets: 349 10*3/uL (ref 150–400)
RBC: 3.7 MIL/uL — ABNORMAL LOW (ref 3.87–5.11)
RDW: 13.2 % (ref 11.5–15.5)
WBC: 15.3 10*3/uL — ABNORMAL HIGH (ref 4.0–10.5)
nRBC: 0 % (ref 0.0–0.2)

## 2018-12-17 LAB — BASIC METABOLIC PANEL
Anion gap: 10 (ref 5–15)
BUN: 14 mg/dL (ref 6–20)
CO2: 24 mmol/L (ref 22–32)
Calcium: 8.2 mg/dL — ABNORMAL LOW (ref 8.9–10.3)
Chloride: 104 mmol/L (ref 98–111)
Creatinine, Ser: 0.47 mg/dL (ref 0.44–1.00)
GFR calc Af Amer: >60 mL/min (ref >60)
GFR calc non Af Amer: >60 mL/min (ref >60)
Glucose, Bld: 142 mg/dL — ABNORMAL HIGH (ref 70–99)
POTASSIUM: 3.6 mmol/L (ref 3.5–5.1)
Sodium: 138 mmol/L (ref 135–145)

## 2018-12-17 LAB — PHOSPHORUS: Phosphorus: 3.7 mg/dL (ref 2.5–4.6)

## 2018-12-17 SURGERY — APPENDECTOMY, LAPAROSCOPIC
Anesthesia: General

## 2018-12-17 MED ORDER — FENTANYL CITRATE (PF) 100 MCG/2ML IJ SOLN: 25.0000 ug | INTRAMUSCULAR | Status: DC | PRN

## 2018-12-17 MED ORDER — HYDROMORPHONE HCL 1 MG/ML IJ SOLN
1.0000 mg | Freq: Once | INTRAMUSCULAR | Status: AC
  Administered 2018-12-17: 1 mg via INTRAVENOUS
  Filled 2018-12-17: qty 1

## 2018-12-17 MED ORDER — ROCURONIUM BROMIDE 10 MG/ML (PF) SYRINGE
PREFILLED_SYRINGE | INTRAVENOUS | Status: DC | PRN
  Administered 2018-12-17: 10 mg via INTRAVENOUS
  Administered 2018-12-17: 30 mg via INTRAVENOUS

## 2018-12-17 MED ORDER — LACTATED RINGERS IV SOLN
INTRAVENOUS | Status: DC
  Administered 2018-12-17 – 2018-12-19 (×6): via INTRAVENOUS

## 2018-12-17 MED ORDER — FENTANYL CITRATE (PF) 100 MCG/2ML IJ SOLN
INTRAMUSCULAR | Status: AC
  Filled 2018-12-17: qty 2

## 2018-12-17 MED ORDER — HYDROMORPHONE HCL 1 MG/ML IJ SOLN
0.5000 mg | INTRAMUSCULAR | Status: DC | PRN
  Administered 2018-12-17 (×2): 0.5 mg via INTRAVENOUS
  Filled 2018-12-17 (×2): qty 1

## 2018-12-17 MED ORDER — BUPIVACAINE-EPINEPHRINE 0.25% -1:200000 IJ SOLN
INTRAMUSCULAR | Status: DC | PRN
  Administered 2018-12-17: 30 mL

## 2018-12-17 MED ORDER — PROPOFOL 10 MG/ML IV BOLUS
INTRAVENOUS | Status: AC
  Filled 2018-12-17: qty 20

## 2018-12-17 MED ORDER — ONDANSETRON 4 MG PO TBDP
4.0000 mg | ORAL_TABLET | Freq: Four times a day (QID) | ORAL | Status: DC | PRN
  Filled 2018-12-17: qty 1

## 2018-12-17 MED ORDER — ACETAMINOPHEN 10 MG/ML IV SOLN
INTRAVENOUS | Status: AC
  Filled 2018-12-17: qty 100

## 2018-12-17 MED ORDER — LIDOCAINE 2% (20 MG/ML) 5 ML SYRINGE
INTRAMUSCULAR | Status: DC | PRN
  Administered 2018-12-17: 60 mg via INTRAVENOUS

## 2018-12-17 MED ORDER — BUPIVACAINE-EPINEPHRINE (PF) 0.25% -1:200000 IJ SOLN
INTRAMUSCULAR | Status: AC
  Filled 2018-12-17: qty 30

## 2018-12-17 MED ORDER — KETOROLAC TROMETHAMINE 30 MG/ML IJ SOLN
30.0000 mg | Freq: Four times a day (QID) | INTRAMUSCULAR | Status: AC | PRN
  Administered 2018-12-17 – 2018-12-19 (×3): 30 mg via INTRAVENOUS
  Filled 2018-12-17 (×3): qty 1

## 2018-12-17 MED ORDER — HYDRALAZINE HCL 20 MG/ML IJ SOLN: 10.0000 mg | INTRAMUSCULAR | Status: DC | PRN

## 2018-12-17 MED ORDER — IBUPROFEN 200 MG PO TABS
600.0000 mg | ORAL_TABLET | Freq: Four times a day (QID) | ORAL | Status: DC | PRN
  Administered 2018-12-18: 600 mg via ORAL
  Filled 2018-12-17: qty 3

## 2018-12-17 MED ORDER — ACETAMINOPHEN 500 MG PO TABS: 1000.0000 mg | ORAL_TABLET | Freq: Four times a day (QID) | ORAL | Status: DC | PRN

## 2018-12-17 MED ORDER — SCOPOLAMINE 1 MG/3DAYS TD PT72
MEDICATED_PATCH | TRANSDERMAL | Status: AC
  Filled 2018-12-17: qty 1

## 2018-12-17 MED ORDER — IPRATROPIUM BROMIDE 0.06 % NA SOLN
2.0000 | Freq: Two times a day (BID) | NASAL | Status: DC
  Filled 2018-12-17: qty 15

## 2018-12-17 MED ORDER — MIDAZOLAM HCL 2 MG/2ML IJ SOLN
INTRAMUSCULAR | Status: DC | PRN
  Administered 2018-12-17: 2 mg via INTRAVENOUS

## 2018-12-17 MED ORDER — LACTATED RINGERS IV SOLN
INTRAVENOUS | Status: DC | PRN
  Administered 2018-12-17 (×2): via INTRAVENOUS

## 2018-12-17 MED ORDER — PROMETHAZINE HCL 25 MG/ML IJ SOLN
12.5000 mg | Freq: Four times a day (QID) | INTRAMUSCULAR | Status: DC | PRN
  Administered 2018-12-17: 12.5 mg via INTRAVENOUS
  Filled 2018-12-17 (×2): qty 1

## 2018-12-17 MED ORDER — TRAMADOL HCL 50 MG PO TABS: 50.0000 mg | ORAL_TABLET | Freq: Four times a day (QID) | ORAL | Status: DC | PRN

## 2018-12-17 MED ORDER — DOCUSATE SODIUM 100 MG PO CAPS
100.0000 mg | ORAL_CAPSULE | Freq: Two times a day (BID) | ORAL | Status: DC
  Administered 2018-12-17 – 2018-12-22 (×11): 100 mg via ORAL
  Filled 2018-12-17 (×12): qty 1

## 2018-12-17 MED ORDER — ROCURONIUM BROMIDE 10 MG/ML (PF) SYRINGE
PREFILLED_SYRINGE | INTRAVENOUS | Status: AC
  Filled 2018-12-17: qty 10

## 2018-12-17 MED ORDER — PROPOFOL 10 MG/ML IV BOLUS
INTRAVENOUS | Status: DC | PRN
  Administered 2018-12-17: 120 mg via INTRAVENOUS

## 2018-12-17 MED ORDER — PIPERACILLIN-TAZOBACTAM 3.375 G IVPB
3.3750 g | Freq: Three times a day (TID) | INTRAVENOUS | Status: DC
  Administered 2018-12-17 – 2018-12-22 (×16): 3.375 g via INTRAVENOUS
  Filled 2018-12-17 (×19): qty 50

## 2018-12-17 MED ORDER — ONDANSETRON HCL 4 MG/2ML IJ SOLN
4.0000 mg | Freq: Four times a day (QID) | INTRAMUSCULAR | Status: DC | PRN
  Filled 2018-12-17: qty 2

## 2018-12-17 MED ORDER — ONDANSETRON HCL 4 MG/2ML IJ SOLN
INTRAMUSCULAR | Status: DC | PRN
  Administered 2018-12-17: 4 mg via INTRAVENOUS

## 2018-12-17 MED ORDER — MIDAZOLAM HCL 2 MG/2ML IJ SOLN
INTRAMUSCULAR | Status: AC
  Filled 2018-12-17: qty 2

## 2018-12-17 MED ORDER — HYDROXYZINE HCL 25 MG PO TABS
25.0000 mg | ORAL_TABLET | Freq: Three times a day (TID) | ORAL | Status: DC | PRN
  Administered 2018-12-18 – 2018-12-19 (×3): 25 mg via ORAL
  Filled 2018-12-17 (×3): qty 1

## 2018-12-17 MED ORDER — DEXAMETHASONE SODIUM PHOSPHATE 10 MG/ML IJ SOLN
INTRAMUSCULAR | Status: AC
  Filled 2018-12-17: qty 1

## 2018-12-17 MED ORDER — ONDANSETRON HCL 4 MG/2ML IJ SOLN
INTRAMUSCULAR | Status: AC
  Filled 2018-12-17: qty 2

## 2018-12-17 MED ORDER — SIMETHICONE 80 MG PO CHEW
40.0000 mg | CHEWABLE_TABLET | Freq: Four times a day (QID) | ORAL | Status: DC | PRN
  Administered 2018-12-19: 40 mg via ORAL
  Filled 2018-12-17: qty 1

## 2018-12-17 MED ORDER — LACTATED RINGERS IR SOLN
Status: DC | PRN
  Administered 2018-12-17: 4000 mL

## 2018-12-17 MED ORDER — FENTANYL CITRATE (PF) 250 MCG/5ML IJ SOLN
INTRAMUSCULAR | Status: DC | PRN
  Administered 2018-12-17 (×7): 50 ug via INTRAVENOUS

## 2018-12-17 MED ORDER — ENOXAPARIN SODIUM 40 MG/0.4ML ~~LOC~~ SOLN
40.0000 mg | SUBCUTANEOUS | Status: DC
  Administered 2018-12-18: 40 mg via SUBCUTANEOUS
  Filled 2018-12-17 (×5): qty 0.4

## 2018-12-17 MED ORDER — METOCLOPRAMIDE HCL 5 MG/ML IJ SOLN
10.0000 mg | Freq: Once | INTRAMUSCULAR | Status: DC
  Filled 2018-12-17: qty 2

## 2018-12-17 MED ORDER — ACETAMINOPHEN 10 MG/ML IV SOLN
1000.0000 mg | Freq: Once | INTRAVENOUS | Status: AC
  Administered 2018-12-17: 1000 mg via INTRAVENOUS

## 2018-12-17 MED ORDER — PROMETHAZINE HCL 25 MG PO TABS
12.5000 mg | ORAL_TABLET | Freq: Four times a day (QID) | ORAL | Status: DC | PRN
  Administered 2018-12-17: 12.5 mg via ORAL
  Filled 2018-12-17: qty 1

## 2018-12-17 MED ORDER — HYDROMORPHONE HCL 1 MG/ML IJ SOLN
1.0000 mg | INTRAMUSCULAR | Status: AC
  Administered 2018-12-17: 1 mg via INTRAVENOUS

## 2018-12-17 MED ORDER — HYDROMORPHONE HCL 1 MG/ML IJ SOLN
1.0000 mg | INTRAMUSCULAR | Status: DC | PRN
  Administered 2018-12-17: 1 mg via INTRAVENOUS
  Filled 2018-12-17 (×2): qty 1

## 2018-12-17 MED ORDER — SCOPOLAMINE 1 MG/3DAYS TD PT72
MEDICATED_PATCH | TRANSDERMAL | Status: DC | PRN
  Administered 2018-12-17: 1 via TRANSDERMAL

## 2018-12-17 MED ORDER — IPRATROPIUM BROMIDE 0.03 % NA SOLN
2.0000 | Freq: Two times a day (BID) | NASAL | Status: DC
  Filled 2018-12-17: qty 30

## 2018-12-17 MED ORDER — SUCCINYLCHOLINE CHLORIDE 200 MG/10ML IV SOSY
PREFILLED_SYRINGE | INTRAVENOUS | Status: DC | PRN
  Administered 2018-12-17: 100 mg via INTRAVENOUS

## 2018-12-17 MED ORDER — SUGAMMADEX SODIUM 200 MG/2ML IV SOLN
INTRAVENOUS | Status: DC | PRN
  Administered 2018-12-17: 150 mg via INTRAVENOUS

## 2018-12-17 MED ORDER — HYDROMORPHONE HCL 1 MG/ML IJ SOLN
0.3000 mg | INTRAMUSCULAR | Status: DC | PRN
  Administered 2018-12-17 (×2): 0.3 mg via INTRAVENOUS
  Filled 2018-12-17 (×4): qty 1

## 2018-12-17 MED ORDER — DEXAMETHASONE SODIUM PHOSPHATE 10 MG/ML IJ SOLN
INTRAMUSCULAR | Status: DC | PRN
  Administered 2018-12-17: 5 mg via INTRAVENOUS

## 2018-12-17 MED ORDER — 0.9 % SODIUM CHLORIDE (POUR BTL) OPTIME
TOPICAL | Status: DC | PRN
  Administered 2018-12-17 (×2): 3000 mL
  Administered 2018-12-17: 1000 mL
  Administered 2018-12-17: 3000 mL

## 2018-12-17 MED ORDER — PROMETHAZINE HCL 25 MG/ML IJ SOLN: 6.2500 mg | INTRAMUSCULAR | Status: DC | PRN

## 2018-12-17 MED ORDER — SUGAMMADEX SODIUM 200 MG/2ML IV SOLN
INTRAVENOUS | Status: AC
  Filled 2018-12-17: qty 2

## 2018-12-17 MED ORDER — SUCCINYLCHOLINE CHLORIDE 200 MG/10ML IV SOSY
PREFILLED_SYRINGE | INTRAVENOUS | Status: AC
  Filled 2018-12-17: qty 10

## 2018-12-17 MED ORDER — FENTANYL CITRATE (PF) 250 MCG/5ML IJ SOLN
INTRAMUSCULAR | Status: AC
  Filled 2018-12-17: qty 5

## 2018-12-17 MED ORDER — HYDROMORPHONE HCL 1 MG/ML IJ SOLN
1.0000 mg | INTRAMUSCULAR | Status: DC | PRN
  Administered 2018-12-18: 1 mg via INTRAVENOUS
  Filled 2018-12-17: qty 1

## 2018-12-17 MED ORDER — LIDOCAINE 2% (20 MG/ML) 5 ML SYRINGE
INTRAMUSCULAR | Status: AC
  Filled 2018-12-17: qty 5

## 2018-12-17 MED ORDER — PANTOPRAZOLE SODIUM 40 MG PO TBEC
40.0000 mg | DELAYED_RELEASE_TABLET | Freq: Every day | ORAL | Status: DC
  Administered 2018-12-17 – 2018-12-22 (×6): 40 mg via ORAL
  Filled 2018-12-17 (×5): qty 1

## 2018-12-17 SURGICAL SUPPLY — 43 items
ADH SKN CLS APL DERMABOND .7 (GAUZE/BANDAGES/DRESSINGS) ×1
APPLIER CLIP 5 13 M/L LIGAMAX5 (MISCELLANEOUS)
APPLIER CLIP ROT 10 11.4 M/L (STAPLE)
APR CLP MED LRG 11.4X10 (STAPLE)
APR CLP MED LRG 5 ANG JAW (MISCELLANEOUS)
BAG SPEC RTRVL 10 TROC 200 (ENDOMECHANICALS) ×1
BAG SPEC RTRVL LRG 6X4 10 (ENDOMECHANICALS)
CABLE HIGH FREQUENCY MONO STRZ (ELECTRODE) IMPLANT
CHLORAPREP W/TINT 26ML (MISCELLANEOUS) ×2 IMPLANT
CLIP APPLIE 5 13 M/L LIGAMAX5 (MISCELLANEOUS) IMPLANT
CLIP APPLIE ROT 10 11.4 M/L (STAPLE) IMPLANT
COVER SURGICAL LIGHT HANDLE (MISCELLANEOUS) ×2 IMPLANT
COVER WAND RF STERILE (DRAPES) IMPLANT
CUTTER FLEX LINEAR 45M (STAPLE) ×1 IMPLANT
DECANTER SPIKE VIAL GLASS SM (MISCELLANEOUS) ×2 IMPLANT
DERMABOND ADVANCED (GAUZE/BANDAGES/DRESSINGS) ×1
DERMABOND ADVANCED .7 DNX12 (GAUZE/BANDAGES/DRESSINGS) IMPLANT
DRAPE LAPAROSCOPIC ABDOMINAL (DRAPES) ×1 IMPLANT
ELECT REM PT RETURN 15FT ADLT (MISCELLANEOUS) ×2 IMPLANT
GLOVE BIOGEL PI IND STRL 7.5 (GLOVE) ×1 IMPLANT
GLOVE BIOGEL PI INDICATOR 7.5 (GLOVE) ×1
GLOVE ECLIPSE 7.5 STRL STRAW (GLOVE) ×2 IMPLANT
GOWN STRL REUS W/TWL XL LVL3 (GOWN DISPOSABLE) ×5 IMPLANT
KIT BASIN OR (CUSTOM PROCEDURE TRAY) ×2 IMPLANT
PAD POSITIONING PINK XL (MISCELLANEOUS) ×2 IMPLANT
POUCH RETRIEVAL ECOSAC 10 (ENDOMECHANICALS) ×1 IMPLANT
POUCH RETRIEVAL ECOSAC 10MM (ENDOMECHANICALS) ×1
POUCH SPECIMEN RETRIEVAL 10MM (ENDOMECHANICALS) IMPLANT
RELOAD 45 VASCULAR/THIN (ENDOMECHANICALS) IMPLANT
RELOAD STAPLE 45 2.5 WHT GRN (ENDOMECHANICALS) IMPLANT
RELOAD STAPLE 45 3.5 BLU ETS (ENDOMECHANICALS) IMPLANT
RELOAD STAPLE TA45 3.5 REG BLU (ENDOMECHANICALS) ×2 IMPLANT
SCISSORS LAP 5X35 DISP (ENDOMECHANICALS) ×2 IMPLANT
SET IRRIG TUBING LAPAROSCOPIC (IRRIGATION / IRRIGATOR) ×2 IMPLANT
SHEARS HARMONIC ACE PLUS 36CM (ENDOMECHANICALS) ×2 IMPLANT
SLEEVE XCEL OPT CAN 5 100 (ENDOMECHANICALS) ×2 IMPLANT
SUT MNCRL AB 4-0 PS2 18 (SUTURE) ×2 IMPLANT
TOWEL OR 17X26 10 PK STRL BLUE (TOWEL DISPOSABLE) ×2 IMPLANT
TRAY FOLEY CATH 14FRSI W/METER (CATHETERS) ×1 IMPLANT
TRAY LAPAROSCOPIC (CUSTOM PROCEDURE TRAY) ×2 IMPLANT
TROCAR BLADELESS OPT 5 100 (ENDOMECHANICALS) ×2 IMPLANT
TROCAR XCEL BLUNT TIP 100MML (ENDOMECHANICALS) ×2 IMPLANT
TUBING INSUF HEATED (TUBING) ×2 IMPLANT

## 2018-12-17 NOTE — H&P (Addendum)
CC: Consult by ED-P for possible appendicitis  HPI: Emily Hayden is an 47 y.o. female with hx of GERD, TMJ, whom presents with 1d hx of progressively worsening RLQ abdominal discomfort.  She has never had this kind of pain before.  She describes the pain as being sharp.  Occasionally it seems to be in her midepigastrium but primarily located in her right lower quadrant.  The pain does not appear to radiate.  She has had associated nausea/vomiting.  She is also had diarrhea.  She denies any blood in her stool.  She denies any recent sick contacts.  She denies any personal or family history of IBD/Crohn's/ulcerative colitis.  She does have a father whom had colon cancer diagnosed at the age of 49.  She has never had a colonoscopy  Past Medical History:  Diagnosis Date  . Laryngopharyngeal reflux 04/05/2013  . Palpitations 04/05/2013   Cardiac evaluation including echo Holter and EKG were all normal   . 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 Urinary frequency 04/05/2013   [facility] with negative findings     Past Surgical History:  Procedure Laterality Date  . CESAREAN SECTION      Family History  Problem Relation Age of Onset  . Heart attack Maternal Grandmother   . Heart attack Maternal Grandfather   . Heart attack Paternal Grandmother   . Heart attack Paternal Grandfather   . Angina Other        Prinzmetel's Variant Angina  . Colon cancer Father     Social:  reports that she has quit smoking. She does not have any smokeless tobacco history on file. No history on file for alcohol and drug.  Allergies:  Allergies  Allergen Reactions  . Ciprocinonide [Fluocinolone]   . Codeine   . Compazine [Prochlorperazine Edisylate]     Medications: I have reviewed the patient's current medications.  Results for orders placed or performed during the hospital encounter of 12/16/18 (from the past 48 hour(s))  Lipase, blood     Status: None   Collection Time:  12/16/18  9:48 PM  Result Value Ref Range   Lipase 36 11 - 51 U/L    Comment: Performed at [facility], [address] [place], [place] [address]  Comprehensive metabolic panel     Status: Abnormal   Collection Time: 12/16/18  9:48 PM  Result Value Ref Range   Sodium 138 135 - 145 mmol/L   Potassium 3.5 3.5 - 5.1 mmol/L   Chloride 104 98 - 111 mmol/L   CO2 23 22 - 32 mmol/L   Glucose, Bld 133 (H) 70 - 99 mg/dL   BUN 18 6 - 20 mg/dL   Creatinine, Ser 1.910.71 0.44 - 1.00 mg/dL   Calcium 9.3 8.9 - 47.810.3 mg/dL   Total Protein 7.3 6.5 - 8.1 g/dL   Albumin 4.5 3.5 - 5.0 g/dL   AST 24 15 - 41 U/L   ALT 18 0 - 44 U/L   Alkaline Phosphatase 49 38 - 126 U/L   Total Bilirubin 0.5 0.3 - 1.2 mg/dL   GFR calc non Af Amer >60 >60 mL/min   GFR calc Af Amer >60 >60 mL/min   Anion gap 11 5 - 15    Comment: Performed at [facility], [address] [place], [place] [address]  CBC     Status: Abnormal   Collection Time: 12/16/18  9:48 PM  Result Value Ref Range   WBC  12.5 (H) 4.0 - 10.5 K/uL   RBC 4.19 3.87 - 5.11 MIL/uL   Hemoglobin 12.8 12.0 - 15.0 g/dL   HCT 91.4 78.2 - 95.6 %   MCV 93.6 80.0 - 100.0 fL   MCH 30.5 26.0 - 34.0 pg   MCHC 32.7 30.0 - 36.0 g/dL   RDW 21.3 08.6 - 57.8 %   Platelets 412 (H) 150 - 400 K/uL   nRBC 0.0 0.0 - 0.2 %    Comment: Performed at [facility], [address] [place], [place] [address]  Urinalysis, Routine w reflex microscopic     Status: Abnormal   Collection Time: 12/16/18  9:48 PM  Result Value Ref Range   Color, Urine YELLOW YELLOW   APPearance HAZY (A) CLEAR   Specific Gravity, Urine 1.015 1.005 - 1.030   pH 9.0 (H) 5.0 - 8.0   Glucose, UA NEGATIVE NEGATIVE mg/dL   Hgb urine dipstick NEGATIVE NEGATIVE   Bilirubin Urine NEGATIVE NEGATIVE   Ketones, ur 80 (A) NEGATIVE mg/dL   Protein, ur NEGATIVE NEGATIVE mg/dL   Nitrite NEGATIVE NEGATIVE   Leukocytes, UA NEGATIVE NEGATIVE    Comment:  Performed at [facility], [address] [place], [place] [address]  I-Stat beta hCG blood, ED     Status: None   Collection Time: 12/16/18  9:52 PM  Result Value Ref Range   I-stat hCG, quantitative <5.0 <5 mIU/mL   Comment 3            Comment:   GEST. AGE      CONC.  (mIU/mL)   <=1 WEEK        5 - 50     2 WEEKS       50 - 500     3 WEEKS       100 - 10,000     4 WEEKS     1,000 - 30,000        FEMALE AND NON-PREGNANT FEMALE:     LESS THAN 5 mIU/mL     Ct Abdomen Pelvis W Contrast  Result Date: 12/16/2018 CLINICAL DATA:  Acute generalized abdomen pain. EXAM: CT ABDOMEN AND PELVIS WITH CONTRAST TECHNIQUE: Multidetector CT imaging of the abdomen and pelvis was performed using the standard protocol following bolus administration of intravenous contrast. CONTRAST:  ISOVUE-300 IOPAMIDOL (ISOVUE-300) INJECTION 61% COMPARISON:  None. FINDINGS: Lower chest: No acute abnormality. Hepatobiliary: Versus are identified within the left lobe liver. There is focal fatty infiltration of liver at the falciform ligament. The gallbladder is normal. The biliary tree is normal. Pancreas: Unremarkable. No pancreatic ductal dilatation or surrounding inflammatory changes. Spleen: Normal in size without focal abnormality. Adrenals/Urinary Tract: Adrenal glands are unremarkable. Kidneys are normal, without renal calculi, focal lesion, or hydronephrosis. Bladder is unremarkable. Stomach/Bowel: There is a focal bowel loop with enhancing wall and surrounding inflammation and internal 8 mm calcification in the right lower quadrant. This may represent an abnormal inflamed appendix. There are several dilated small bowel loops in the right lower quadrant, this is most likely due to focal ileus from surrounding inflammatory change. The colon is normal. The stomach is normal. Vascular/Lymphatic: No significant vascular findings are present. No enlarged abdominal or pelvic lymph nodes. Reproductive:  Uterus and bilateral adnexa are unremarkable. Other: None. Musculoskeletal: No acute or significant osseous findings. IMPRESSION: There is a focal bowel loop with enhancing wall and surrounding inflammation and internal 8 mm calcification in the right lower quadrant. This may represent  an abnormal inflamed appendix. Several dilated small bowel loops in the right lower quadrant, this is most likely due to focal ileus from surrounding inflammatory change. Electronically Signed   By: Sherian Rein M.D.   On: 12/16/2018 23:59    ROS - all of the below systems have been reviewed with the patient and positives are indicated with bold text General: chills, fever or night sweats Eyes: blurry vision or double vision ENT: epistaxis or sore throat Allergy/Immunology: itchy/watery eyes or nasal congestion Hematologic/Lymphatic: bleeding problems, blood clots or swollen lymph nodes Endocrine: temperature intolerance or unexpected weight changes Breast: new or changing breast lumps or nipple discharge Resp: cough, shortness of breath, or wheezing CV: chest pain or dyspnea on exertion GI: as per HPI GU: dysuria, trouble voiding, or hematuria MSK: joint pain or joint stiffness Neuro: TIA or stroke symptoms Derm: pruritus and skin lesion changes Psych: anxiety and depression  PE Blood pressure (!) 149/80, pulse 95, temperature (!) 97.4 F (36.3 C), temperature source Oral, resp. rate 16, SpO2 94 %. Constitutional: NAD; conversant; no deformities Eyes: Moist conjunctiva; no lid lag; anicteric; PERRL Neck: Trachea midline; no thyromegaly Lungs: Normal respiratory effort; no tactile fremitus CV: RRR; no palpable thrills; no pitting edema GI: Abd soft, ttp in RLQ; no tenderness elsewhere; no rebound; no guarding; no palpable hepatosplenomegaly MSK: Normal gait; no clubbing/cyanosis Psychiatric: Appropriate affect; alert and oriented x3 Lymphatic: No palpable cervical or axillary lymphadenopathy  Results  for orders placed or performed during the hospital encounter of 12/16/18 (from the past 48 hour(s))  Lipase, blood     Status: None   Collection Time: 12/16/18  9:48 PM  Result Value Ref Range   Lipase 36 11 - 51 U/L    Comment: Performed at [facility], [address] [place], [place] [address]  Comprehensive metabolic panel     Status: Abnormal   Collection Time: 12/16/18  9:48 PM  Result Value Ref Range   Sodium 138 135 - 145 mmol/L   Potassium 3.5 3.5 - 5.1 mmol/L   Chloride 104 98 - 111 mmol/L   CO2 23 22 - 32 mmol/L   Glucose, Bld 133 (H) 70 - 99 mg/dL   BUN 18 6 - 20 mg/dL   Creatinine, Ser 4.09 0.44 - 1.00 mg/dL   Calcium 9.3 8.9 - 81.1 mg/dL   Total Protein 7.3 6.5 - 8.1 g/dL   Albumin 4.5 3.5 - 5.0 g/dL   AST 24 15 - 41 U/L   ALT 18 0 - 44 U/L   Alkaline Phosphatase 49 38 - 126 U/L   Total Bilirubin 0.5 0.3 - 1.2 mg/dL   GFR calc non Af Amer >60 >60 mL/min   GFR calc Af Amer >60 >60 mL/min   Anion gap 11 5 - 15    Comment: Performed at [facility], [address] [place], [place] [address]  CBC     Status: Abnormal   Collection Time: 12/16/18  9:48 PM  Result Value Ref Range   WBC 12.5 (H) 4.0 - 10.5 K/uL   RBC 4.19 3.87 - 5.11 MIL/uL   Hemoglobin 12.8 12.0 - 15.0 g/dL   HCT 29.5 62.1 - 30.8 %   MCV 93.6 80.0 - 100.0 fL   MCH 30.5 26.0 - 34.0 pg   MCHC 32.7 30.0 - 36.0 g/dL   RDW 65.7 84.6 - 96.2 %   Platelets 412 (H) 150 - 400 K/uL   nRBC 0.0 0.0 - 0.2 %  Comment: Performed at [facility], [address] [place], [place] [address]  Urinalysis, Routine w reflex microscopic     Status: Abnormal   Collection Time: 12/16/18  9:48 PM  Result Value Ref Range   Color, Urine YELLOW YELLOW   APPearance HAZY (A) CLEAR   Specific Gravity, Urine 1.015 1.005 - 1.030   pH 9.0 (H) 5.0 - 8.0   Glucose, UA NEGATIVE NEGATIVE mg/dL   Hgb urine dipstick NEGATIVE NEGATIVE   Bilirubin Urine NEGATIVE NEGATIVE    Ketones, ur 80 (A) NEGATIVE mg/dL   Protein, ur NEGATIVE NEGATIVE mg/dL   Nitrite NEGATIVE NEGATIVE   Leukocytes, UA NEGATIVE NEGATIVE    Comment: Performed at [facility], [address], [place], [place] [address]  I-Stat beta hCG blood, ED     Status: None   Collection Time: 12/16/18  9:52 PM  Result Value Ref Range   I-stat hCG, quantitative <5.0 <5 mIU/mL   Comment 3            Comment:   GEST. AGE      CONC.  (mIU/mL)   <=1 WEEK        5 - 50     2 WEEKS       50 - 500     3 WEEKS       100 - 10,000     4 WEEKS     1,000 - 30,000        FEMALE AND NON-PREGNANT FEMALE:     LESS THAN 5 mIU/mL     Ct Abdomen Pelvis W Contrast  Result Date: 12/16/2018 CLINICAL DATA:  Acute generalized abdomen pain. EXAM: CT ABDOMEN AND PELVIS WITH CONTRAST TECHNIQUE: Multidetector CT imaging of the abdomen and pelvis was performed using the standard protocol following bolus administration of intravenous contrast. CONTRAST:  ISOVUE-300 IOPAMIDOL (ISOVUE-300) INJECTION 61% COMPARISON:  None. FINDINGS: Lower chest: No acute abnormality. Hepatobiliary: Versus are identified within the left lobe liver. There is focal fatty infiltration of liver at the falciform ligament. The gallbladder is normal. The biliary tree is normal. Pancreas: Unremarkable. No pancreatic ductal dilatation or surrounding inflammatory changes. Spleen: Normal in size without focal abnormality. Adrenals/Urinary Tract: Adrenal glands are unremarkable. Kidneys are normal, without renal calculi, focal lesion, or hydronephrosis. Bladder is unremarkable. Stomach/Bowel: There is a focal bowel loop with enhancing wall and surrounding inflammation and internal 8 mm calcification in the right lower quadrant. This may represent an abnormal inflamed appendix. There are several dilated small bowel loops in the right lower quadrant, this is most likely due to focal ileus from surrounding inflammatory change. The colon is normal.  The stomach is normal. Vascular/Lymphatic: No significant vascular findings are present. No enlarged abdominal or pelvic lymph nodes. Reproductive: Uterus and bilateral adnexa are unremarkable. Other: None. Musculoskeletal: No acute or significant osseous findings. IMPRESSION: There is a focal bowel loop with enhancing wall and surrounding inflammation and internal 8 mm calcification in the right lower quadrant. This may represent an abnormal inflamed appendix. Several dilated small bowel loops in the right lower quadrant, this is most likely due to focal ileus from surrounding inflammatory change. Electronically Signed   By: Sherian Rein M.D.   On: 12/16/2018 23:59   A/P: Emily Hayden is an 47 y.o. female with most likely acute appendicitis with associated phlegmon involving adjacent small bowel; other differential includes IBD/Crohn's, neoplasm  -I had a long discussion with the patient and her husband, Ivin Booty, about all  the above.  Her imaging does appear somewhat atypical and there is small bowel surrounding what appears to be a dilated appendix with an appendicolith. -Will plan admission to hospital -NPO, MIVF -IV abx -Follow exam, clinical picture, wbc; may end up needing appendectomy if she does not improve.  We discussed that in the setting of a phlegmon somewhat intimately associated with surrounding loops of bowel, there is a higher probability of needing adjacent bowel resections performed including ileocecectomy. -We discussed that regardless of ultimate outcome this admission, she will need colonoscopy given her age and family history. -All of her questions were answered to her satisfaction, she voiced understanding and agreement with plan.  Stephanie Couphristopher M. Cliffton AstersWhite, M.D. [facility]

## 2018-12-17 NOTE — Progress Notes (Signed)
Patient awoke at 1420 in excruciating pain at RLQ; pain has now expanded tp the left lower quadrant as well. Vitals: T 98.7, BP 120/66; pulse 125 O2 99i%. MD paged.

## 2018-12-17 NOTE — Anesthesia Postprocedure Evaluation (Signed)
Anesthesia Post Note  Patient: Emily Hayden  Procedure(s) Performed: APPENDECTOMY LAPAROSCOPIC (N/A )     Patient location during evaluation: PACU Anesthesia Type: General Level of consciousness: awake and alert Pain management: pain level controlled Vital Signs Assessment: post-procedure vital signs reviewed and stable Respiratory status: spontaneous breathing, nonlabored ventilation, respiratory function stable and patient connected to nasal cannula oxygen Cardiovascular status: blood pressure returned to baseline and stable Postop Assessment: no apparent nausea or vomiting Anesthetic complications: no    Last Vitals:  Vitals:   12/17/18 1634 12/17/18 1936  BP: 120/66 140/75  Pulse: (!) 125 (!) 133  Resp:    Temp:  36.4 C  SpO2: 99% 95%    Last Pain:  Vitals:   12/17/18 1945  TempSrc:   PainSc: [name]                 [name]

## 2018-12-17 NOTE — ED Notes (Signed)
Attempted to call report, no answer at this time.

## 2018-12-17 NOTE — Progress Notes (Signed)
Patient ID: Emily Hayden, female   DOB: Jun 05, 1971, 47 y.o.   MRN: [id number]     Subjective: Patient has had a dramatic increase in her abdominal pain this afternoon.  Not really responsive to IV meds.  Still located right lower quadrant but has spread somewhat across her lower abdomen.  Objective: Vital signs in last 24 hours: Temp:  [97.4 F (36.3 C)-99.3 F (37.4 C)] 98.7 F (37.1 C) (12/29 1633) Pulse Rate:  [79-131] 125 (12/29 1634) Resp:  [14-20] 18 (12/29 1337) BP: (108-151)/(66-97) 120/66 (12/29 1634) SpO2:  [94 %-100 %] 99 % (12/29 1634) Weight:  [72.6 kg] 72.6 kg (12/29 0340) Last BM Date: 12/16/18  Intake/Output from previous day: 12/28 0701 - 12/29 0700 In: 1314 [I.V.:264; IV Piggyback:1050] Out: -  Intake/Output this shift: No intake/output data recorded.  General appearance: alert, cooperative, severe distress and Moaning in pain and difficulty lying down in bed GI: Very tender across lower abdomen right greater than left.  Firm.  Lab Results:  Recent Labs    12/16/18 2148 12/17/18 0610  WBC 12.5* 15.3*  HGB 12.8 11.8*  HCT 39.2 36.1  PLT 412* 349   BMET Recent Labs    12/16/18 2148 12/17/18 0610  NA 138 138  K 3.5 3.6  CL 104 104  CO2 23 24  GLUCOSE 133* 142*  BUN 18 14  CREATININE 0.71 0.47  CALCIUM 9.3 8.2*     Studies/Results: Ct Abdomen Pelvis W Contrast  Result Date: 12/16/2018 CLINICAL DATA:  Acute generalized abdomen pain. EXAM: CT ABDOMEN AND PELVIS WITH CONTRAST TECHNIQUE: Multidetector CT imaging of the abdomen and pelvis was performed using the standard protocol following bolus administration of intravenous contrast. CONTRAST:  100mL ISOVUE-300 IOPAMIDOL (ISOVUE-300) INJECTION 61% COMPARISON:  None. FINDINGS: Lower chest: No acute abnormality. Hepatobiliary: Versus are identified within the left lobe liver. There is focal fatty infiltration of liver at the falciform ligament. The gallbladder is normal. The biliary tree is normal.  Pancreas: Unremarkable. No pancreatic ductal dilatation or surrounding inflammatory changes. Spleen: Normal in size without focal abnormality. Adrenals/Urinary Tract: Adrenal glands are unremarkable. Kidneys are normal, without renal calculi, focal lesion, or hydronephrosis. Bladder is unremarkable. Stomach/Bowel: There is a focal bowel loop with enhancing wall and surrounding inflammation and internal 8 mm calcification in the right lower quadrant. This may represent an abnormal inflamed appendix. There are several dilated small bowel loops in the right lower quadrant, this is most likely due to focal ileus from surrounding inflammatory change. The colon is normal. The stomach is normal. Vascular/Lymphatic: No significant vascular findings are present. No enlarged abdominal or pelvic lymph nodes. Reproductive: Uterus and bilateral adnexa are unremarkable. Other: None. Musculoskeletal: No acute or significant osseous findings. IMPRESSION: There is a focal bowel loop with enhancing wall and surrounding inflammation and internal 8 mm calcification in the right lower quadrant. This may represent an abnormal inflamed appendix. Several dilated small bowel loops in the right lower quadrant, this is most likely due to focal ileus from surrounding inflammatory change. Electronically Signed   By: Sherian ReinWei-Chen  Lin M.D.   On: 12/16/2018 23:59    Anti-infectives: Anti-infectives (From admission, onward)   Start     Dose/Rate Route Frequency Ordered Stop   12/17/18 0345  piperacillin-tazobactam (ZOSYN) IVPB 3.375 g     3.375 g 12.5 mL/hr over 240 Minutes Intravenous Every 8 hours 12/17/18 [id number]        Assessment/Plan: Apparent acute appendicitis.  Somewhat unusual CT scan without clearly identified appendix but  likely central very dilated appendix with appendicolith.  Surrounding thickened small bowel loops. Initial plan was for antibiotic management for possible phlegmon and increased risk of more extensive surgery.   However she clearly is worsening and will require emergency surgery. I discussed all this with the patient and her husband.  We discussed the benefits of initial plan of nonoperative management which clearly we all feel is not working.  Discussed the nature of surgery including initial laparoscopic approach with fairly high chance of open procedure or even ileocecectomy or small bowel resection.  Discussed risks of anesthetic complications, bleeding, infection and likely open wound if she requires open surgery.  All questions answered and they agree to proceed.    LOS: 0 days    Mariella SaaBenjamin T Jennyfer Nickolson 12/17/2018

## 2018-12-17 NOTE — Progress Notes (Signed)
Subjective/Chief Complaint: Feels a tiny bit better, but it has only been around 8 hours since admit.  Nausea improved, but zofran less helpful than phenergan.  Started period.  Having cramps.     Objective: Vital signs in last 24 hours: Temp:  [97.4 F (36.3 C)-98.2 F (36.8 C)] 98.2 F (36.8 C) (12/29 0950) Pulse Rate:  [79-118] 118 (12/29 0950) Resp:  [14-20] 18 (12/29 0950) BP: (137-151)/(69-97) 137/69 (12/29 0950) SpO2:  [94 %-100 %] 100 % (12/29 0950) Weight:  [72.6 kg] 72.6 kg (12/29 0340) Last BM Date: 12/16/18  Intake/Output from previous day: 12/28 0701 - 12/29 0700 In: 1314 [I.V.:264; IV Piggyback:1050] Out: -  Intake/Output this shift: No intake/output data recorded.  General appearance: alert, cooperative, mild distress and able to talk and move without distress Resp: breathing normally GI: soft, non distended, focal RLQ tenderness.    Lab Results:  Recent Labs    12/16/18 2148 12/17/18 0610  WBC 12.5* 15.3*  HGB 12.8 11.8*  HCT 39.2 36.1  PLT 412* 349   BMET Recent Labs    12/16/18 2148 12/17/18 0610  NA 138 138  K 3.5 3.6  CL 104 104  CO2 23 24  GLUCOSE 133* 142*  BUN 18 14  CREATININE 0.71 0.47  CALCIUM 9.3 8.2*   PT/INR No results for input(s): LABPROT, INR in the last 72 hours. ABG No results for input(s): PHART, HCO3 in the last 72 hours.  Invalid input(s): PCO2, PO2  Studies/Results: Ct Abdomen Pelvis W Contrast  Result Date: 12/16/2018 CLINICAL DATA:  Acute generalized abdomen pain. EXAM: CT ABDOMEN AND PELVIS WITH CONTRAST TECHNIQUE: Multidetector CT imaging of the abdomen and pelvis was performed using the standard protocol following bolus administration of intravenous contrast. CONTRAST:  100mL ISOVUE-300 IOPAMIDOL (ISOVUE-300) INJECTION 61% COMPARISON:  None. FINDINGS: Lower chest: No acute abnormality. Hepatobiliary: Versus are identified within the left lobe liver. There is focal fatty infiltration of liver at the  falciform ligament. The gallbladder is normal. The biliary tree is normal. Pancreas: Unremarkable. No pancreatic ductal dilatation or surrounding inflammatory changes. Spleen: Normal in size without focal abnormality. Adrenals/Urinary Tract: Adrenal glands are unremarkable. Kidneys are normal, without renal calculi, focal lesion, or hydronephrosis. Bladder is unremarkable. Stomach/Bowel: There is a focal bowel loop with enhancing wall and surrounding inflammation and internal 8 mm calcification in the right lower quadrant. This may represent an abnormal inflamed appendix. There are several dilated small bowel loops in the right lower quadrant, this is most likely due to focal ileus from surrounding inflammatory change. The colon is normal. The stomach is normal. Vascular/Lymphatic: No significant vascular findings are present. No enlarged abdominal or pelvic lymph nodes. Reproductive: Uterus and bilateral adnexa are unremarkable. Other: None. Musculoskeletal: No acute or significant osseous findings. IMPRESSION: There is a focal bowel loop with enhancing wall and surrounding inflammation and internal 8 mm calcification in the right lower quadrant. This may represent an abnormal inflamed appendix. Several dilated small bowel loops in the right lower quadrant, this is most likely due to focal ileus from surrounding inflammatory change. Electronically Signed   By: Sherian ReinWei-Chen  Lin M.D.   On: 12/16/2018 23:59    Anti-infectives: Anti-infectives (From admission, onward)   Start     Dose/Rate Route Frequency Ordered Stop   12/17/18 0345  piperacillin-tazobactam (ZOSYN) IVPB 3.375 g     3.375 g 12.5 mL/hr over 240 Minutes Intravenous Every 8 hours 12/17/18 [id number]        Assessment/Plan: s/p * No surgery  found * RLQ phlegmon  Appendicitis vs enteritis  If enteritis, ? Infectious vs autoimmune.   Will treat with bowel rest, IV fluids, and IV antibiotics.  Will follow WBCs, symptoms, vitals. May need dx  laparoscopy, appendectomy if no improvement.     LOS: 0 days    Almond LintFaera Emilliano Dilworth 12/17/2018

## 2018-12-17 NOTE — Op Note (Signed)
Preoperative Diagnosis: Abdominal Pain/ Vomiting   Postoprative Diagnosis: Abdominal Pain/ Vomiting   Procedure: Procedure(s): APPENDECTOMY LAPAROSCOPIC   Surgeon: Glenna FellowsHoxworth, Laiah Pouncey T   Assistants: None  Anesthesia:  General endotracheal anesthesia  Indications: Patient is a 47 year old female admitted last night with right lower quadrant pain and CT scan showing likely acute appendicitis with phlegmon and thickened loops of small bowel surrounding this.  Appendix could not be 100% identified but highly suspicious for acute appendicitis with phlegmon.  Initially nonoperative management was planned with antibiotics and observation due to the apparent phlegmon.  However over the course of today the patient has developed dramatically worsening abdominal pain and tenderness and after discussion regarding indications and risks detailed elsewhere we have elected to proceed with emergency appendectomy.    Procedure Detail: Patient was brought to the operating room, placed in the supine position on the operating table, and general endotracheal anesthesia induced.  Foley catheter was placed.  The abdomen was widely sterilely prepped and draped.  She was already on broad-spectrum IV antibiotics.  Patient timeout was performed and correct procedure verified.  Access was obtained with a 1/2 cm incision at the umbilicus with an open Hassan technique through mattress suture of 0 Vicryl and pneumoperitoneum was established.  There was obvious purulent and greenish peritoneal fluid diffusely.  Some exudate over small bowel loops.  Under direct vision a 5 mm trocar was placed in the epigastrium and a second 5 mm trocar in the left lower quadrant.  With careful blunt dissection adherent small bowel loops were able to be relatively easily bluntly dissected away from the inflammatory process medial and inferior to the cecum.  I exposed a necrotic appendix.  The appendix was able to be relatively easily bluntly  dissected up out of the retroperitoneum and elevated.  There was a perforation at the base but approximately 1 cm of edematous but otherwise healthy appendix off the tip of the cecum.  Small amount of feculent contamination was suctioned and irrigated.  The mesoappendix was sequentially divided with the harmonic scalpel.  Lateral peritoneal attachments to the cecum were divided mobilizing the tip of the cecum.  I then came across the tip of the cecum with a single firing of the Endo GIA 45 mm blue load stapler which was at least a centimeter or so below the perforation through relatively healthy tissue.  The appendix was placed in an eco-sac and brought out through the umbilical incision.  Following this a very extensive abdominal irrigation was performed.  There was frankly purulent fluid in the pelvis and all 4 quadrants of the abdomen.  I carefully irrigated the entire abdomen with 8 L of warm saline dividing any inflammatory adhesions between small bowel loops and irrigating and suctioning until clear both subdiaphragmatic spaces, Morison's pouch, both gutters and we loops of small bowel and the pelvis.  At this point all irrigation was clear.  No evidence of bleeding.  Staple line was healthy and intact.  All CO2 was evacuated and trochars removed and the mattress suture secured at the umbilicus.  Skin incisions were closed with subcuticular Monocryl and Dermabond.  Sponge needle and instrument counts were correct.     Findings: Acute appendicitis with gangrene and perforation  Estimated Blood Loss:  Minimal         Drains: None  Blood Given: none          Specimens: Appendix        Complications:  * No complications entered in OR  log *         Disposition: PACU - hemodynamically stable.         Condition: stable

## 2018-12-17 NOTE — Anesthesia Procedure Notes (Signed)
Procedure Name: Intubation Date/Time: 12/17/2018 6:06 PM Performed by: Minerva EndsMirarchi, Braelon Sprung M, CRNA Pre-anesthesia Checklist: Patient identified, Emergency Drugs available, Suction available and Patient being monitored Patient Re-evaluated:Patient Re-evaluated prior to induction Oxygen Delivery Method: Circle System Utilized Preoxygenation: Pre-oxygenation with 100% oxygen Induction Type: IV induction Ventilation: Mask ventilation without difficulty Laryngoscope Size: Miller and 2 Grade View: Grade I Tube type: Oral Tube size: 7.0 mm Number of attempts: 1 Airway Equipment and Method: Stylet Placement Confirmation: ETT inserted through vocal cords under direct vision,  positive ETCO2 and breath sounds checked- equal and bilateral Secured at: 21 cm Tube secured with: Tape Dental Injury: Teeth and Oropharynx as per pre-operative assessment  Comments: Smooth IV induction Fitzgerald-- RSI-- intubation AM CRNA-- atraumatic -- teeth and mouth as preop-- chipping present on front teeth -- pt did not report in preop-- unchanged with laryngoscopy-- dental advisory was given by MD and CRNA preop-- pt also complains of TMJ-- demonstrates adequate mouth opening without symptoms

## 2018-12-17 NOTE — Anesthesia Preprocedure Evaluation (Signed)
Anesthesia Evaluation  Patient identified by MRN, date of birth, ID band Patient awake    Reviewed: Allergy & Precautions, NPO status , Patient's Chart, lab work & pertinent test results  Airway Mallampati: II  TM Distance: >3 FB Neck ROM: Full    Dental  (+) Dental Advisory Given   Pulmonary former smoker,    breath sounds clear to auscultation       Cardiovascular negative cardio ROS   Rhythm:Regular Rate:Normal     Neuro/Psych negative neurological ROS     GI/Hepatic Neg liver ROS, GERD  ,Acute appendicitis   Endo/Other  negative endocrine ROS  Renal/GU negative Renal ROS     Musculoskeletal   Abdominal   Peds  Hematology negative hematology ROS (+)   Anesthesia Other Findings   Reproductive/Obstetrics                             Lab Results  Component Value Date   WBC 15.3 (H) 12/17/2018   HGB 11.8 (L) 12/17/2018   HCT 36.1 12/17/2018   MCV 97.6 12/17/2018   PLT 349 12/17/2018   Lab Results  Component Value Date   CREATININE 0.47 12/17/2018   BUN 14 12/17/2018   NA 138 12/17/2018   K 3.6 12/17/2018   CL 104 12/17/2018   CO2 24 12/17/2018    Anesthesia Physical Anesthesia Plan  ASA: II and emergent  Anesthesia Plan: General   Post-op Pain Management:    Induction: Intravenous  PONV Risk Score and Plan: 3 and Ondansetron, Dexamethasone and Treatment may vary due to age or medical condition  Airway Management Planned: Oral ETT  Additional Equipment:   Intra-op Plan:   Post-operative Plan: Extubation in OR  Informed Consent: I have reviewed the patients History and Physical, chart, labs and discussed the procedure including the risks, benefits and alternatives for the proposed anesthesia with the patient or authorized representative who has indicated his/her understanding and acceptance.   Dental advisory given  Plan Discussed with: CRNA  Anesthesia Plan  Comments:         Anesthesia Quick Evaluation

## 2018-12-17 NOTE — Transfer of Care (Signed)
Immediate Anesthesia Transfer of Care Note  Patient: Emily Hayden  Procedure(s) Performed: APPENDECTOMY LAPAROSCOPIC (N/A )  Patient Location: PACU  Anesthesia Type:General  Level of Consciousness: awake, sedated and responds to stimulation  Airway & Oxygen Therapy: Patient Spontanous Breathing and Patient connected to face mask oxygen  Post-op Assessment: Report given to RN and Post -op Vital signs reviewed and stable  Post vital signs: Reviewed and stable  Last Vitals:  Vitals Value Taken Time  BP 140/75 12/17/2018  7:36 PM  Temp    Pulse 53 12/17/2018  7:36 PM  Resp 14 12/17/2018  7:41 PM  SpO2 100% 12/17/2018  7:36 PM  Vitals shown include unvalidated device data.  Last Pain:  Vitals:   12/17/18 1700  TempSrc:   PainSc: 10-Worst pain ever      Patients Stated Pain Goal: 0 (12/17/18 1653)  Complications: No apparent anesthesia complications

## 2018-12-17 NOTE — ED Notes (Signed)
ED TO INPATIENT HANDOFF REPORT  Name/Age/Gender Emily Hayden 47 y.o. female  Code Status   Home/SNF/Other Home  Chief Complaint Abdominal Pain/ Vomiting   Level of Care/Admitting Diagnosis ED Disposition    ED Disposition Condition Comment   Admit  [facility] [[id number]]  Level of Care: Med-Surg [16]  Diagnosis: Acute appendicitis [[id number]][[id number]]  Admitting Physician: CCS, MD [[id number]]  Attending Physician: CCS, MD [[id number]]  Estimated length of stay: past midnight tomorrow  Certification:: I certify this patient will need inpatient services for at least 2 midnights  PT Class (Do Not Modify): Inpatient [101]  PT Acc Code (Do Not Modify): Private [1]       Medical History Past Medical History:  Diagnosis Date  . Laryngopharyngeal reflux 04/05/2013  . Palpitations 04/05/2013   Cardiac evaluation including echo Holter and EKG were all normal   . 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 Urinary frequency 04/05/2013   [facility]     Allergies Allergies  Allergen Reactions  . Ciprocinonide [Fluocinolone]   . Codeine   . Compazine [Prochlorperazine Edisylate]     IV Location/Drains/Wounds Patient Lines/Drains/Airways Status   Active Line/Drains/Airways    Name:   Placement date:   Placement time:   Site:   Days:   Peripheral IV 12/16/18 Left Antecubital   12/16/18    2144    Antecubital   1          Labs/Imaging Results for orders placed or performed during the hospital encounter of 12/16/18 (from the past 48 hour(s))  Lipase, blood     Status: None   Collection Time: 12/16/18  9:48 PM  Result Value Ref Range   Lipase 36 11 - 51 U/L    Comment: Performed at [facility], [address] [place], [place] [address]  Comprehensive metabolic panel     Status: Abnormal   Collection Time: 12/16/18  9:48 PM  Result Value Ref Range   Sodium 138 135 - 145 mmol/L   Potassium 3.5 3.5  - 5.1 mmol/L   Chloride 104 98 - 111 mmol/L   CO2 23 22 - 32 mmol/L   Glucose, Bld 133 (H) 70 - 99 mg/dL   BUN 18 6 - 20 mg/dL   Creatinine, Ser 3.080.71 0.44 - 1.00 mg/dL   Calcium 9.3 8.9 - 65.710.3 mg/dL   Total Protein 7.3 6.5 - 8.1 g/dL   Albumin 4.5 3.5 - 5.0 g/dL   AST 24 15 - 41 U/L   ALT 18 0 - 44 U/L   Alkaline Phosphatase 49 38 - 126 U/L   Total Bilirubin 0.5 0.3 - 1.2 mg/dL   GFR calc non Af Amer >60 >60 mL/min   GFR calc Af Amer >60 >60 mL/min   Anion gap 11 5 - 15    Comment: Performed at [facility], [address] [place], [place] [address]  CBC     Status: Abnormal   Collection Time: 12/16/18  9:48 PM  Result Value Ref Range   WBC 12.5 (H) 4.0 - 10.5 K/uL   RBC 4.19 3.87 - 5.11 MIL/uL   Hemoglobin 12.8 12.0 - 15.0 g/dL   HCT 29.539.2 28.436.0 - 13.246.0 %   MCV 93.6 80.0 - 100.0 fL   MCH 30.5 26.0 - 34.0 pg   MCHC 32.7 30.0 - 36.0 g/dL   RDW 44.013.0 10.211.5 - 72.515.5 %   Platelets 412 (H) 150 -  400 K/uL   nRBC 0.0 0.0 - 0.2 %    Comment: Performed at [facility], [address] [place], [place] [address]  Urinalysis, Routine w reflex microscopic     Status: Abnormal   Collection Time: 12/16/18  9:48 PM  Result Value Ref Range   Color, Urine YELLOW YELLOW   APPearance HAZY (A) CLEAR   Specific Gravity, Urine 1.015 1.005 - 1.030   pH 9.0 (H) 5.0 - 8.0   Glucose, UA NEGATIVE NEGATIVE mg/dL   Hgb urine dipstick NEGATIVE NEGATIVE   Bilirubin Urine NEGATIVE NEGATIVE   Ketones, ur 80 (A) NEGATIVE mg/dL   Protein, ur NEGATIVE NEGATIVE mg/dL   Nitrite NEGATIVE NEGATIVE   Leukocytes, UA NEGATIVE NEGATIVE    Comment: Performed at [facility], [address] [place], [place] [address]  I-Stat beta hCG blood, ED     Status: None   Collection Time: 12/16/18  9:52 PM  Result Value Ref Range   I-stat hCG, quantitative <5.0 <5 mIU/mL   Comment 3            Comment:   GEST. AGE      CONC.  (mIU/mL)   <=1 WEEK        5 - 50     2  WEEKS       50 - 500     3 WEEKS       100 - 10,000     4 WEEKS     1,000 - 30,000        FEMALE AND NON-PREGNANT FEMALE:     LESS THAN 5 mIU/mL    Ct Abdomen Pelvis W Contrast  Result Date: 12/16/2018 CLINICAL DATA:  Acute generalized abdomen pain. EXAM: CT ABDOMEN AND PELVIS WITH CONTRAST TECHNIQUE: Multidetector CT imaging of the abdomen and pelvis was performed using the standard protocol following bolus administration of intravenous contrast. CONTRAST:  ISOVUE-300 IOPAMIDOL (ISOVUE-300) INJECTION 61% COMPARISON:  None. FINDINGS: Lower chest: No acute abnormality. Hepatobiliary: Versus are identified within the left lobe liver. There is focal fatty infiltration of liver at the falciform ligament. The gallbladder is normal. The biliary tree is normal. Pancreas: Unremarkable. No pancreatic ductal dilatation or surrounding inflammatory changes. Spleen: Normal in size without focal abnormality. Adrenals/Urinary Tract: Adrenal glands are unremarkable. Kidneys are normal, without renal calculi, focal lesion, or hydronephrosis. Bladder is unremarkable. Stomach/Bowel: There is a focal bowel loop with enhancing wall and surrounding inflammation and internal 8 mm calcification in the right lower quadrant. This may represent an abnormal inflamed appendix. There are several dilated small bowel loops in the right lower quadrant, this is most likely due to focal ileus from surrounding inflammatory change. The colon is normal. The stomach is normal. Vascular/Lymphatic: No significant vascular findings are present. No enlarged abdominal or pelvic lymph nodes. Reproductive: Uterus and bilateral adnexa are unremarkable. Other: None. Musculoskeletal: No acute or significant osseous findings. IMPRESSION: There is a focal bowel loop with enhancing wall and surrounding inflammation and internal 8 mm calcification in the right lower quadrant. This may represent an abnormal inflamed appendix. Several dilated small bowel  loops in the right lower quadrant, this is most likely due to focal ileus from surrounding inflammatory change. Electronically Signed   By: Sherian Rein M.D.   On: 12/16/2018 23:59   None  Pending Labs [employer] (From admission, onward)    Start     Ordered   Signed and Held  HIV antibody (Routine Testing)  Once,   R     Signed and Held   Signed and Held  CBC  Tomorrow morning,   R     Signed and Held   Signed and Held  Basic metabolic panel  Tomorrow morning,   R     Signed and Held   Signed and Held  Magnesium  Tomorrow morning,   R     Signed and Held   Signed and Held  Phosphorus  Tomorrow morning,   R     Signed and Held          Vitals/Pain Today's Vitals   12/17/18 0030 12/17/18 0200 12/17/18 0215 12/17/18 0230  BP: (!) 149/80 (!) 151/83  (!) 145/87  Pulse: 95 93 94 94  Resp: 16   18  Temp:      TempSrc:      SpO2: 94% 100% 100% 99%  PainSc:        Isolation Precautions No active isolations  Medications Medications  iopamidol (ISOVUE-300) 61 % injection (has no administration in time range)  sodium chloride (PF) 0.9 % injection (has no administration in time range)  metoCLOPramide (REGLAN) injection 10 mg (0 mg Intravenous Hold 12/17/18 0130)  ondansetron (ZOFRAN) injection 4 mg (4 mg Intravenous Given 12/16/18 2147)  fentaNYL (SUBLIMAZE) injection 50 mcg (50 mcg Intravenous Given 12/16/18 2151)  promethazine (PHENERGAN) injection 25 mg (25 mg Intravenous Given 12/16/18 2248)  sodium chloride 0.9 % bolus 1,000 mL (0 mLs Intravenous Stopped 12/17/18 0045)  morphine 4 MG/ML injection 4 mg (4 mg Intravenous Given 12/16/18 2252)  iopamidol (ISOVUE-300) 61 % injection 100 mL (100 mLs Intravenous Contrast Given 12/16/18 2340)  HYDROmorphone (DILAUDID) injection 1 mg (1 mg Intravenous Given 12/17/18 0044)    Mobility walks

## 2018-12-18 ENCOUNTER — Encounter (HOSPITAL_COMMUNITY): Payer: Self-pay | Admitting: General Surgery

## 2018-12-18 LAB — CBC WITH DIFFERENTIAL/PLATELET
ABS IMMATURE GRANULOCYTES: 0.1 10*3/uL — AB (ref 0.00–0.07)
BAND NEUTROPHILS: 33 %
Basophils Absolute: 0 10*3/uL (ref 0.0–0.1)
Basophils Relative: 0 %
Eosinophils Absolute: 0 10*3/uL (ref 0.0–0.5)
Eosinophils Relative: 0 %
HCT: 34.5 % — ABNORMAL LOW (ref 36.0–46.0)
Hemoglobin: 11 g/dL — ABNORMAL LOW (ref 12.0–15.0)
Lymphocytes Relative: 9 %
Lymphs Abs: 0.4 10*3/uL — ABNORMAL LOW (ref 0.7–4.0)
MCH: 30.6 pg (ref 26.0–34.0)
MCHC: 31.9 g/dL (ref 30.0–36.0)
MCV: 95.8 fL (ref 80.0–100.0)
MYELOCYTES: 3 %
Monocytes Absolute: 0.5 10*3/uL (ref 0.1–1.0)
Monocytes Relative: 12 %
Neutro Abs: 3.4 10*3/uL (ref 1.7–7.7)
Neutrophils Relative %: 43 %
Platelets: 311 10*3/uL (ref 150–400)
RBC: 3.6 MIL/uL — ABNORMAL LOW (ref 3.87–5.11)
RDW: 13.5 % (ref 11.5–15.5)
WBC Morphology: INCREASED
WBC: 4.5 10*3/uL (ref 4.0–10.5)
nRBC: 0 % (ref 0.0–0.2)

## 2018-12-18 LAB — BASIC METABOLIC PANEL
ANION GAP: 9 (ref 5–15)
BUN: 14 mg/dL (ref 6–20)
CO2: 24 mmol/L (ref 22–32)
Calcium: 7.6 mg/dL — ABNORMAL LOW (ref 8.9–10.3)
Chloride: 101 mmol/L (ref 98–111)
Creatinine, Ser: 0.6 mg/dL (ref 0.44–1.00)
GFR calc non Af Amer: >60 mL/min (ref >60)
Glucose, Bld: 115 mg/dL — ABNORMAL HIGH (ref 70–99)
Potassium: 3.5 mmol/L (ref 3.5–5.1)
Sodium: 134 mmol/L — ABNORMAL LOW (ref 135–145)

## 2018-12-18 LAB — HIV ANTIBODY (ROUTINE TESTING W REFLEX): HIV Screen 4th Generation wRfx: NONREACTIVE

## 2018-12-18 MED ORDER — ACETAMINOPHEN 325 MG PO TABS: 650.0000 mg | ORAL_TABLET | Freq: Four times a day (QID) | ORAL | Status: DC

## 2018-12-18 MED ORDER — ACETAMINOPHEN 325 MG PO TABS
650.0000 mg | ORAL_TABLET | Freq: Four times a day (QID) | ORAL | Status: DC
  Administered 2018-12-18 – 2018-12-22 (×15): 650 mg via ORAL
  Filled 2018-12-18 (×16): qty 2

## 2018-12-18 MED ORDER — HYDROMORPHONE HCL 1 MG/ML IJ SOLN: 0.5000 mg | INTRAMUSCULAR | Status: DC | PRN

## 2018-12-18 MED ORDER — OXYCODONE HCL 5 MG PO TABS
5.0000 mg | ORAL_TABLET | ORAL | Status: DC | PRN
  Administered 2018-12-18: 10 mg via ORAL
  Administered 2018-12-18 (×3): 5 mg via ORAL
  Administered 2018-12-19 – 2018-12-22 (×13): 10 mg via ORAL
  Filled 2018-12-18: qty 1
  Filled 2018-12-18: qty 2
  Filled 2018-12-18: qty 1
  Filled 2018-12-18 (×8): qty 2
  Filled 2018-12-18: qty 1
  Filled 2018-12-18 (×2): qty 2
  Filled 2018-12-18: qty 1
  Filled 2018-12-18 (×3): qty 2

## 2018-12-18 NOTE — ED Provider Notes (Signed)
[facility] Provider Note   CSN: [id number] Arrival date & time: 12/16/18  2046     History   Chief Complaint Chief Complaint  Patient presents with  . Abdominal Pain    HPI Emily Hayden is a 47 y.o. female.  HPI Patient presents to the emergency department with abdominal pain that started last night but got worse throughout the day today.  The patient states that she did not take any medications prior to arrival for her symptoms.  She states the pain got significantly worse over the last few hours prior to arrival in the ER.  The patient states that she is also had nausea and vomiting.  Patient has been otherwise healthy.  The patient states that she has never had significant abdominal surgeries in the past.  The patient denies chest pain, shortness of breath, headache,blurred vision, neck pain, fever, cough, weakness, numbness, dizziness, anorexia, edema, diarrhea, rash, back pain, dysuria, hematemesis, bloody stool, near syncope, or syncope. Past Medical History:  Diagnosis Date  . Laryngopharyngeal reflux 04/05/2013  . Palpitations 04/05/2013   Cardiac evaluation including echo Holter and EKG were all normal   . 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 Urinary frequency 04/05/2013   [facility]     Patient Active Problem List   Diagnosis Date Noted  . Acute appendicitis 12/17/2018  . 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 Urinary frequency 04/05/2013  . Palpitations 04/05/2013  . Laryngopharyngeal reflux 04/05/2013    Past Surgical History:  Procedure Laterality Date  . CESAREAN SECTION       OB History   No obstetric history on file.      Home Medications    Prior to Admission medications   Medication Sig Start Date End Date Taking? Authorizing Provider  acetaminophen (TYLENOL) 500 MG tablet Take 1,000 mg by mouth every 6 (six) hours as needed for mild pain, moderate pain or headache.   Yes  [provider]  ALPRAZolam Prudy Feeler(XANAX) 0.5 MG tablet Take 0.5 mg by mouth at bedtime as needed.   Yes [provider]  Amphetamine Sulfate (EVEKEO) 10 MG TABS Take 10 mg by mouth daily after breakfast.    Yes [provider]  chlorpheniramine (CHLOR-TRIMETON) 4 MG tablet Take 4 mg by mouth at bedtime as needed for allergies.   Yes [provider]  cyclobenzaprine (FLEXERIL) 10 MG tablet Take 1 tablet (10 mg total) by mouth at bedtime. 10/07/16  Yes English, Judeth CornfieldStephanie D, PA  ibuprofen (ADVIL,MOTRIN) 200 MG tablet Take 400 mg by mouth at bedtime.   Yes [provider]  magnesium hydroxide (MILK OF MAGNESIA) 400 MG/5ML suspension Take 15 mLs by mouth daily as needed for mild constipation.   Yes [provider]  Multiple Vitamin CAPS Take 1 capsule by mouth 2 (two) times daily. Reported on 04/19/2016   Yes [provider]  Niacin (VITAMIN B-3 PO) Take 1 tablet by mouth 2 (two) times daily.   Yes [provider]  OVER THE COUNTER MEDICATION Take 1 capsule by mouth 2 (two) times daily.   Yes [provider]  hydrOXYzine (ATARAX/VISTARIL) 25 MG tablet Take 1 tablet (25 mg total) by mouth every 8 (eight) hours as needed for itching. Patient not taking: Reported on 12/16/2018 04/19/16   Trena PlattEnglish, Stephanie D, PA  ipratropium (ATROVENT) 0.03 % nasal spray Place 2 sprays into both nostrils 2 (two) times daily. Patient not taking: Reported on 04/19/2016 09/09/15  Trena PlattEnglish, Stephanie D, PA  pantoprazole (PROTONIX) 40 MG tablet Take 1 tablet (40 mg total) by mouth daily. PATIENT NEEDS OFFICE VISIT FOR ADDITIONAL REFILLS Patient not taking: Reported on 09/09/2015 06/02/15   Morrell RiddleWeber, Sarah L, PA-C    Family History Family History  Problem Relation Age of Onset  . Heart attack Maternal Grandmother   . Heart attack Maternal Grandfather   . Heart attack Paternal Grandmother   . Heart attack Paternal Grandfather   . Angina Other         Prinzmetel's Variant Angina  . Colon cancer Father     Social History Social History   Tobacco Use  . Smoking status: Former Games developermoker  . Smokeless tobacco: Never Used  Substance Use Topics  . Alcohol use: Not on file  . Drug use: Not on file     Allergies   Ciprocinonide [fluocinolone]; Codeine; and Compazine [prochlorperazine edisylate]   Review of Systems Review of Systems  All other systems negative except as documented in the HPI. All pertinent positives and negatives as reviewed in the HPI. Physical Exam Updated Vital Signs BP 103/61 (BP Location: Right Arm)   Pulse (!) 110   Temp 98.6 F (37 C)   Resp 15   Ht 5\' 4"  (1.626 m)   Wt 72.6 kg   SpO2 100%   BMI 27.47 kg/m   Physical Exam Vitals signs and nursing note reviewed.  Constitutional:      General: She is not in acute distress.    Appearance: She is well-developed.  HENT:     Head: Normocephalic and atraumatic.  Eyes:     Pupils: Pupils are equal, round, and reactive to light.  Neck:     Musculoskeletal: Normal range of motion and neck supple.  Cardiovascular:     Rate and Rhythm: Normal rate and regular rhythm.     Heart sounds: Normal heart sounds. No murmur. No friction rub. No gallop.   Pulmonary:     Effort: Pulmonary effort is normal. No respiratory distress.     Breath sounds: Normal breath sounds. No wheezing.  Abdominal:     General: Bowel sounds are normal. There is no distension.     Palpations: Abdomen is soft.     Tenderness: There is abdominal tenderness in the right lower quadrant, periumbilical area and suprapubic area. There is guarding. There is no rebound.     Hernia: No hernia is present.  Skin:    General: Skin is warm and dry.     Capillary Refill: Capillary refill takes less than 2 seconds.     Findings: No erythema or rash.  Neurological:     Mental Status: She is alert and oriented to person, place, and time.     Motor: No abnormal muscle tone.     Coordination:  Coordination normal.  Psychiatric:        Behavior: Behavior normal.      ED Treatments / Results  Labs (all labs ordered are listed, but only abnormal results are displayed) Labs Reviewed  COMPREHENSIVE METABOLIC PANEL - Abnormal; Notable for the following components:      Result Value   Glucose, Bld 133 (*)    All other components within normal limits  CBC - Abnormal; Notable for the following components:   WBC 12.5 (*)    Platelets 412 (*)    All other components within normal limits  URINALYSIS, ROUTINE W REFLEX MICROSCOPIC - Abnormal; Notable for the following components:   APPearance HAZY (*)  pH 9.0 (*)    Ketones, ur 80 (*)    All other components within normal limits  CBC - Abnormal; Notable for the following components:   WBC 15.3 (*)    RBC 3.70 (*)    Hemoglobin 11.8 (*)    All other components within normal limits  BASIC METABOLIC PANEL - Abnormal; Notable for the following components:   Glucose, Bld 142 (*)    Calcium 8.2 (*)    All other components within normal limits  LIPASE, BLOOD  MAGNESIUM  PHOSPHORUS  HIV ANTIBODY (ROUTINE TESTING W REFLEX)  CBC WITH DIFFERENTIAL/PLATELET  BASIC METABOLIC PANEL  I-STAT BETA HCG BLOOD, ED ([facility], [facility], [facility] ONLY)  SURGICAL PATHOLOGY    EKG None  Radiology Ct Abdomen Pelvis W Contrast  Result Date: 12/16/2018 CLINICAL DATA:  Acute generalized abdomen pain. EXAM: CT ABDOMEN AND PELVIS WITH CONTRAST TECHNIQUE: Multidetector CT imaging of the abdomen and pelvis was performed using the standard protocol following bolus administration of intravenous contrast. CONTRAST:  ISOVUE-300 IOPAMIDOL (ISOVUE-300) INJECTION 61% COMPARISON:  None. FINDINGS: Lower chest: No acute abnormality. Hepatobiliary: Versus are identified within the left lobe liver. There is focal fatty infiltration of liver at the falciform ligament. The gallbladder is normal. The biliary tree is normal. Pancreas: Unremarkable. No pancreatic ductal  dilatation or surrounding inflammatory changes. Spleen: Normal in size without focal abnormality. Adrenals/Urinary Tract: Adrenal glands are unremarkable. Kidneys are normal, without renal calculi, focal lesion, or hydronephrosis. Bladder is unremarkable. Stomach/Bowel: There is a focal bowel loop with enhancing wall and surrounding inflammation and internal 8 mm calcification in the right lower quadrant. This may represent an abnormal inflamed appendix. There are several dilated small bowel loops in the right lower quadrant, this is most likely due to focal ileus from surrounding inflammatory change. The colon is normal. The stomach is normal. Vascular/Lymphatic: No significant vascular findings are present. No enlarged abdominal or pelvic lymph nodes. Reproductive: Uterus and bilateral adnexa are unremarkable. Other: None. Musculoskeletal: No acute or significant osseous findings. IMPRESSION: There is a focal bowel loop with enhancing wall and surrounding inflammation and internal 8 mm calcification in the right lower quadrant. This may represent an abnormal inflamed appendix. Several dilated small bowel loops in the right lower quadrant, this is most likely due to focal ileus from surrounding inflammatory change. Electronically Signed   By: Sherian Rein M.D.   On: 12/16/2018 23:59    Procedures Procedures (including critical care time)  Medications Ordered in ED Medications  iopamidol (ISOVUE-300) 61 % injection (  Canceled Entry 12/16/18 2345)  sodium chloride (PF) 0.9 % injection (  See Procedure Record 12/17/18 0550)  pantoprazole (PROTONIX) EC tablet 40 mg ( Oral MAR Unhold 12/17/18 2048)  hydrOXYzine (ATARAX/VISTARIL) tablet 25 mg ( Oral MAR Unhold 12/17/18 2048)  enoxaparin (LOVENOX) injection 40 mg ( Subcutaneous MAR Unhold 12/17/18 2048)  lactated ringers infusion ( Intravenous New Bag/Given 12/17/18 2320)  piperacillin-tazobactam (ZOSYN) IVPB 3.375 g (3.375 g Intravenous New Bag/Given  12/17/18 2133)  ibuprofen (ADVIL,MOTRIN) tablet 600 mg (600 mg Oral Given 12/18/18 0150)  ondansetron (ZOFRAN-ODT) disintegrating tablet 4 mg ( Oral MAR Unhold 12/17/18 2048)    Or  ondansetron (ZOFRAN) injection 4 mg ( Intravenous MAR Unhold 12/17/18 2048)  simethicone (MYLICON) chewable tablet 40 mg ( Oral MAR Unhold 12/17/18 2048)  docusate sodium (COLACE) capsule 100 mg (100 mg Oral Given 12/17/18 2316)  traMADol (ULTRAM) tablet 50 mg ( Oral MAR Unhold 12/17/18 2048)  hydrALAZINE (APRESOLINE) injection 10 mg (  Intravenous MAR Unhold 12/17/18 2048)  ipratropium (ATROVENT) 0.06 % nasal spray 2 spray (2 sprays Each Nare Not Given 12/17/18 2108)  ketorolac (TORADOL) 30 MG/ML injection 30 mg ( Intravenous MAR Unhold 12/17/18 2048)  acetaminophen (TYLENOL) tablet 1,000 mg ( Oral MAR Unhold 12/17/18 2048)  promethazine (PHENERGAN) injection 12.5 mg ( Intravenous MAR Unhold 12/17/18 2048)  HYDROmorphone (DILAUDID) injection 1 mg (has no administration in time range)  acetaminophen (OFIRMEV) 10 MG/ML IV (has no administration in time range)  ondansetron (ZOFRAN) injection 4 mg (4 mg Intravenous Given 12/16/18 2147)  fentaNYL (SUBLIMAZE) injection 50 mcg (50 mcg Intravenous Given 12/16/18 2151)  promethazine (PHENERGAN) injection 25 mg (25 mg Intravenous Given 12/16/18 2248)  sodium chloride 0.9 % bolus 1,000 mL (0 mLs Intravenous Stopped 12/17/18 0045)  morphine 4 MG/ML injection 4 mg (4 mg Intravenous Given 12/16/18 2252)  iopamidol (ISOVUE-300) 61 % injection 100 mL (100 mLs Intravenous Contrast Given 12/16/18 2340)  HYDROmorphone (DILAUDID) injection 1 mg (1 mg Intravenous Given 12/17/18 0044)  HYDROmorphone (DILAUDID) injection 1 mg (1 mg Intravenous Given 12/17/18 1653)  scopolamine (TRANSDERM-SCOP) 1 MG/3DAYS (  Override pull for Anesthesia 12/17/18 1755)  acetaminophen (OFIRMEV) IV 1,000 mg (1,000 mg Intravenous New Bag/Given 12/17/18 2023)     Initial Impression / Assessment and Plan /  ED Course  I have reviewed the triage vital signs and the nursing notes.  Pertinent labs & imaging results that were available during my care of the patient were reviewed by me and considered in my medical decision making (see chart for details).     The patient has right lower quadrant abdominal pain that is fairly significant and I spoke with general surgery about the patient and her CT scan findings.  They will come down and evaluate the patient for admission.  Patient has been somewhat difficult to control her pain.  I advised the patient of the plan and need for admission.  I feel that the patient CT scan does not give a definitive diagnosis of appendicitis but am concerned for this based on the fact that she has right lower quadrant pain and these inflammatory change in the right lower quadrant with a possible appendicolith on CT scan.  Final Clinical Impressions(s) / ED Diagnoses   Final diagnoses:  None    ED Discharge Orders    None       Charlestine Night, PA-C 12/18/18 1610    Pricilla Loveless, MD 12/21/18 [phone]

## 2018-12-18 NOTE — Progress Notes (Signed)
[facility] Progress Note  1 Day Post-Op   Assessment/Plan  Perforated appendicitis - S/P lap appy, Dr. Johna Hayden, 12/19  FEN: CLD VTE: SCD's, lovenox ID: Zosyn Foley: none Follow up: TBD  DISPO: encourage ambulation, IS, pain control, CLD    LOS: 1 day    Subjective: CC: abdominal pain  Pain better since surgery. Having R sided pain and R shoulder pain. No nausea or vomiting overnight. No flatus.   Objective: Vital signs in last 24 hours: Temp:  [97.6 F (36.4 C)-101.2 F (38.4 C)] 99.8 F (37.7 C) (12/30 0542) Pulse Rate:  [103-133] 103 (12/30 0542) Resp:  [14-20] 18 (12/30 0542) BP: (103-140)/(49-86) 109/67 (12/30 0542) SpO2:  [95 %-100 %] 95 % (12/30 0542) Last BM Date: 12/16/18  Intake/Output from previous day: 12/29 0701 - 12/30 0700 In: 2500.1 [I.V.:2416.3; IV Piggyback:83.8] Out: 225 [Urine:215; Blood:10] Intake/Output this shift: No intake/output data recorded.  PE: Gen:  Alert, NAD, pleasant, cooperative Card:  Mild tachycardia, regular rhythm, no M/G/R heard Pulm:  CTA, no W/R/R, rate and effort normal Abd: Soft, ND, +BS, incisions are well appearing, generalized TTP without peritonitis Skin: no rashes noted, warm and dry   Anti-infectives: Anti-infectives (From admission, onward)   Start     Dose/Rate Route Frequency Ordered Stop   12/17/18 0345  piperacillin-tazobactam (ZOSYN) IVPB 3.375 g     3.375 g 12.5 mL/hr over 240 Minutes Intravenous Every 8 hours 12/17/18 0337        Lab Results:  Recent Labs    12/17/18 0610 12/18/18 0452  WBC 15.3* 4.5  HGB 11.8* 11.0*  HCT 36.1 34.5*  PLT 349 311   BMET Recent Labs    12/17/18 0610 12/18/18 0452  NA 138 134*  K 3.6 3.5  CL 104 101  CO2 24 24  GLUCOSE 142* 115*  BUN 14 14  CREATININE 0.47 0.60  CALCIUM 8.2* 7.6*   PT/INR No results for input(s): LABPROT, INR in the last 72 hours. CMP     Component Value Date/Time   NA 134 (Hayden) 12/18/2018 0452   K 3.5  12/18/2018 0452   CL 101 12/18/2018 0452   CO2 24 12/18/2018 0452   GLUCOSE 115 (H) 12/18/2018 0452   BUN 14 12/18/2018 0452   CREATININE 0.60 12/18/2018 0452   CREATININE 0.76 05/15/2014 1416   CALCIUM 7.6 (Hayden) 12/18/2018 0452   PROT 7.3 12/16/2018 2148   ALBUMIN 4.5 12/16/2018 2148   AST 24 12/16/2018 2148   ALT 18 12/16/2018 2148   ALKPHOS 49 12/16/2018 2148   BILITOT 0.5 12/16/2018 2148   GFRNONAA >60 12/18/2018 0452   GFRAA >60 12/18/2018 0452   Lipase     Component Value Date/Time   LIPASE 36 12/16/2018 2148    Studies/Results: Ct Abdomen Pelvis W Contrast  Result Date: 12/16/2018 CLINICAL DATA:  Acute generalized abdomen pain. EXAM: CT ABDOMEN AND PELVIS WITH CONTRAST TECHNIQUE: Multidetector CT imaging of the abdomen and pelvis was performed using the standard protocol following bolus administration of intravenous contrast. CONTRAST:  100mL ISOVUE-300 IOPAMIDOL (ISOVUE-300) INJECTION 61% COMPARISON:  None. FINDINGS: Lower chest: No acute abnormality. Hepatobiliary: Versus are identified within the left lobe liver. There is focal fatty infiltration of liver at the falciform ligament. The gallbladder is normal. The biliary tree is normal. Pancreas: Unremarkable. No pancreatic ductal dilatation or surrounding inflammatory changes. Spleen: Normal in size without focal abnormality. Adrenals/Urinary Tract: Adrenal glands are unremarkable. Kidneys are normal, without renal calculi, focal lesion, or hydronephrosis. Bladder is  unremarkable. Stomach/Bowel: There is a focal bowel loop with enhancing wall and surrounding inflammation and internal 8 mm calcification in the right lower quadrant. This may represent an abnormal inflamed appendix. There are several dilated small bowel loops in the right lower quadrant, this is most likely due to focal ileus from surrounding inflammatory change. The colon is normal. The stomach is normal. Vascular/Lymphatic: No significant vascular findings are  present. No enlarged abdominal or pelvic lymph nodes. Reproductive: Uterus and bilateral adnexa are unremarkable. Other: None. Musculoskeletal: No acute or significant osseous findings. IMPRESSION: There is a focal bowel loop with enhancing wall and surrounding inflammation and internal 8 mm calcification in the right lower quadrant. This may represent an abnormal inflamed appendix. Several dilated small bowel loops in the right lower quadrant, this is most likely due to focal ileus from surrounding inflammatory change. Electronically Signed   By: Sherian ReinWei-Chen  Hayden M.D.   On: 12/16/2018 23:59      Emily SimonJessica Hayden  , [facility] 12/18/2018, 9:09 AM  Pager: [phone] Mon-Wed, Friday 7:00am-4:30pm Thurs 7am-11:30am  Consults: [phone]

## 2018-12-19 LAB — CBC
HEMATOCRIT: 33.3 % — AB (ref 36.0–46.0)
HEMOGLOBIN: 10.5 g/dL — AB (ref 12.0–15.0)
MCH: 31.3 pg (ref 26.0–34.0)
MCHC: 31.5 g/dL (ref 30.0–36.0)
MCV: 99.1 fL (ref 80.0–100.0)
Platelets: 292 10*3/uL (ref 150–400)
RBC: 3.36 MIL/uL — ABNORMAL LOW (ref 3.87–5.11)
RDW: 13.6 % (ref 11.5–15.5)
WBC: 6.3 10*3/uL (ref 4.0–10.5)
nRBC: 0 % (ref 0.0–0.2)

## 2018-12-19 NOTE — Progress Notes (Signed)
[facility] was called in regards to changing the pt's Atarax to Benadryl at the pt's request. The nurse said she would page the doctor. Will continue to monitor.  Jilda Rocheaeja, RN

## 2018-12-19 NOTE — Progress Notes (Signed)
[facility] Progress Note  2 Days Post-Op   Assessment/Plan  Perforated appendicitis - S/P lap appy, Dr. Johna SheriffHoxworth, 12/19  FEN: CLD VTE: SCD's, lovenox ID: Zosyn Foley: none Follow up: TBD  DISPO: encourage ambulation, IS, pain control, full liquids today, soft diet dinner.  Hopefully home tomorrow if passing gas.  Given amount of purulence, pt at high risk for post op ileus.  Will wait for flatus prior to d/c.      LOS: 2 days    Subjective: CC: abdominal pain  Pain around 3-4 if not moving, goes to 7-8 if moving.  Pt taking oxy and this is working pretty well.  She is trying to avoid the IV narcotics at this point.  + belching, but no flatus.    Objective: Vital signs in last 24 hours: Temp:  [97.4 F (36.3 C)-97.8 F (36.6 C)] 97.8 F (36.6 C) (12/31 0504) Pulse Rate:  [96-107] 103 (12/31 0504) Resp:  [17] 17 (12/31 0504) BP: (90-102)/(57-65) 102/57 (12/31 0504) SpO2:  [96 %-100 %] 100 % (12/31 0504) Last BM Date: 12/16/18  Intake/Output from previous day: 12/30 0701 - 12/31 0700 In: 1961.3 [P.O.:200; I.V.:1648.8; IV Piggyback:112.5] Out: 350 [Urine:350] Intake/Output this shift: No intake/output data recorded.  PE: Gen:  Alert, NAD, pleasant, cooperative Pulm:   rate and effort normal Abd: Soft, ND, +incisions c/d/i. Skin: no rashes noted, warm and dry   Anti-infectives: Anti-infectives (From admission, onward)   Start     Dose/Rate Route Frequency Ordered Stop   12/17/18 0345  piperacillin-tazobactam (ZOSYN) IVPB 3.375 g     3.375 g 12.5 mL/hr over 240 Minutes Intravenous Every 8 hours 12/17/18 0337        Lab Results:  Recent Labs    12/18/18 0452 12/19/18 0551  WBC 4.5 6.3  HGB 11.0* 10.5*  HCT 34.5* 33.3*  PLT 311 292   BMET Recent Labs    12/17/18 0610 12/18/18 0452  NA 138 134*  K 3.6 3.5  CL 104 101  CO2 24 24  GLUCOSE 142* 115*  BUN 14 14  CREATININE 0.47 0.60  CALCIUM 8.2* 7.6*   PT/INR No results  for input(s): LABPROT, INR in the last 72 hours. CMP     Component Value Date/Time   NA 134 (L) 12/18/2018 0452   K 3.5 12/18/2018 0452   CL 101 12/18/2018 0452   CO2 24 12/18/2018 0452   GLUCOSE 115 (H) 12/18/2018 0452   BUN 14 12/18/2018 0452   CREATININE 0.60 12/18/2018 0452   CREATININE 0.76 05/15/2014 1416   CALCIUM 7.6 (L) 12/18/2018 0452   PROT 7.3 12/16/2018 2148   ALBUMIN 4.5 12/16/2018 2148   AST 24 12/16/2018 2148   ALT 18 12/16/2018 2148   ALKPHOS 49 12/16/2018 2148   BILITOT 0.5 12/16/2018 2148   GFRNONAA >60 12/18/2018 0452   GFRAA >60 12/18/2018 0452   Lipase     Component Value Date/Time   LIPASE 36 12/16/2018 2148    Studies/Results: No results found.    Almond LintFaera Kunaal Walkins , MD [facility] 12/19/2018, 8:53 AM

## 2018-12-20 MED ORDER — KCL IN DEXTROSE-NACL 20-5-0.45 MEQ/L-%-% IV SOLN
INTRAVENOUS | Status: DC
  Administered 2018-12-20 – 2018-12-21 (×2): via INTRAVENOUS
  Filled 2018-12-20 (×4): qty 1000

## 2018-12-20 MED ORDER — DIPHENHYDRAMINE HCL 25 MG PO CAPS: 25.0000 mg | ORAL_CAPSULE | Freq: Four times a day (QID) | ORAL | Status: DC | PRN

## 2018-12-20 MED ORDER — HYDROXYZINE HCL 25 MG PO TABS
25.0000 mg | ORAL_TABLET | Freq: Three times a day (TID) | ORAL | Status: DC | PRN
  Administered 2018-12-20: 25 mg via ORAL
  Filled 2018-12-20: qty 1

## 2018-12-20 NOTE — Progress Notes (Signed)
[facility] Progress Note  3 Days Post-Op   Assessment/Plan  Perforated appendicitis - S/P lap appy, Dr. Johna Sheriff, 12/19 Post op ileus - anticipated.   FEN: CLD VTE: SCD's, lovenox ID: Zosyn Foley: none Follow up: TBD  DISPO: Pt improving clinically, but given lack of flatus and continued belching, will not d/c today.  High risk of readmission.    LOS: 3 days    Subjective: CC: itching. Pt main complaint is itching all over.  No rash present.  Not associated with any medication administration.  Pt afebrile, but still no flatus or BM.  Pt is being very judicious and not taking in too much.  No n/v.  Soreness improving.    Objective: Vital signs in last 24 hours: Temp:  [99 F (37.2 C)] 99 F (37.2 C) (01/01 0509) Pulse Rate:  [98-111] 98 (01/01 0509) Resp:  [16-18] 16 (01/01 0509) BP: (100-108)/(58-64) 100/58 (01/01 0509) SpO2:  [98 %] 98 % (01/01 0509) Last BM Date: 12/16/18  Intake/Output from previous day: 12/31 0701 - 01/01 0700 In: 3407.6 [P.O.:1030; I.V.:2140.8; IV Piggyback:236.8] Out: 1400 [Urine:1400] Intake/Output this shift: No intake/output data recorded.  PE: Gen:  Alert, NAD, pleasant, cooperative Pulm:   rate and effort normal Abd: Soft, +incisions c/d/i. A bit bloated today.   Skin: no rashes noted, warm and dry   Anti-infectives: Anti-infectives (From admission, onward)   Start     Dose/Rate Route Frequency Ordered Stop   12/17/18 0345  piperacillin-tazobactam (ZOSYN) IVPB 3.375 g     3.375 g 12.5 mL/hr over 240 Minutes Intravenous Every 8 hours 12/17/18 0337        Lab Results:  Recent Labs    12/18/18 0452 12/19/18 0551  WBC 4.5 6.3  HGB 11.0* 10.5*  HCT 34.5* 33.3*  PLT 311 292   BMET Recent Labs    12/18/18 0452  NA 134*  K 3.5  CL 101  CO2 24  GLUCOSE 115*  BUN 14  CREATININE 0.60  CALCIUM 7.6*   PT/INR No results for input(s): LABPROT, INR in the last 72 hours. CMP     Component Value  Date/Time   NA 134 (L) 12/18/2018 0452   K 3.5 12/18/2018 0452   CL 101 12/18/2018 0452   CO2 24 12/18/2018 0452   GLUCOSE 115 (H) 12/18/2018 0452   BUN 14 12/18/2018 0452   CREATININE 0.60 12/18/2018 0452   CREATININE 0.76 05/15/2014 1416   CALCIUM 7.6 (L) 12/18/2018 0452   PROT 7.3 12/16/2018 2148   ALBUMIN 4.5 12/16/2018 2148   AST 24 12/16/2018 2148   ALT 18 12/16/2018 2148   ALKPHOS 49 12/16/2018 2148   BILITOT 0.5 12/16/2018 2148   GFRNONAA >60 12/18/2018 0452   GFRAA >60 12/18/2018 0452   Lipase     Component Value Date/Time   LIPASE 36 12/16/2018 2148    Studies/Results: No results found.    Almond Lint , MD [facility] 12/20/2018, 8:59 AM

## 2018-12-21 MED ORDER — IBUPROFEN 200 MG PO TABS
400.0000 mg | ORAL_TABLET | ORAL | Status: DC | PRN
  Administered 2018-12-21: 400 mg via ORAL
  Filled 2018-12-21 (×2): qty 2

## 2018-12-21 MED ORDER — PHENOL 1.4 % MT LIQD
1.0000 | OROMUCOSAL | Status: DC | PRN
  Filled 2018-12-21: qty 177

## 2018-12-21 MED ORDER — METOCLOPRAMIDE HCL 5 MG/ML IJ SOLN
10.0000 mg | Freq: Four times a day (QID) | INTRAMUSCULAR | Status: AC
  Filled 2018-12-21: qty 2

## 2018-12-21 NOTE — Progress Notes (Signed)
4 Days Post-Op   Subjective/Chief Complaint: Passed flatus just once yesterday Denies nausea   Objective: Vital signs in last 24 hours: Temp:  [98.8 F (37.1 C)-100.2 F (37.9 C)] 98.8 F (37.1 C) (01/02 0526) Pulse Rate:  [95-114] 95 (01/02 0526) Resp:  [17-18] 17 (01/02 0526) BP: (101-119)/(72-81) 101/81 (01/02 0526) SpO2:  [98 %-100 %] 100 % (01/02 0526) Last BM Date: 12/17/19  Intake/Output from previous day: 01/01 0701 - 01/02 0700 In: 1847.7 [P.O.:960; I.V.:744.6; IV Piggyback:143.1] Out: 3100 [Urine:3100] Intake/Output this shift: Total I/O In: -  Out: 250 [Urine:250]  Exam: Abdomen still a little bloated, minimally tender, Occasional BS  Lab Results:  Recent Labs    12/19/18 0551  WBC 6.3  HGB 10.5*  HCT 33.3*  PLT 292   BMET No results for input(s): NA, K, CL, CO2, GLUCOSE, BUN, CREATININE, CALCIUM in the last 72 hours. PT/INR No results for input(s): LABPROT, INR in the last 72 hours. ABG No results for input(s): PHART, HCO3 in the last 72 hours.  Invalid input(s): PCO2, PO2  Studies/Results: No results found.  Anti-infectives: Anti-infectives (From admission, onward)   Start     Dose/Rate Route Frequency Ordered Stop   12/17/18 0345  piperacillin-tazobactam (ZOSYN) IVPB 3.375 g     3.375 g 12.5 mL/hr over 240 Minutes Intravenous Every 8 hours 12/17/18 1093        Assessment/Plan: s/p Procedure(s): APPENDECTOMY LAPAROSCOPIC (N/A)  Post op ileus  Continue to ambulate.  Hopefully will resolve soon.  LOS: 4 days    Abigail Miyamoto 12/21/2018

## 2018-12-21 NOTE — Discharge Instructions (Signed)
[facility] ° °Please arrive at least 30 min before your appointment to complete your check in paperwork.  If you are unable to arrive 30 min prior to your appointment time we may have to cancel or reschedule you. °LAPAROSCOPIC SURGERY: POST OP INSTRUCTIONS °Always review your discharge instruction sheet given to you by the facility where your surgery was performed. °IF YOU HAVE DISABILITY OR FAMILY LEAVE FORMS, YOU MUST BRING THEM TO THE OFFICE FOR PROCESSING.   °DO NOT GIVE THEM TO YOUR DOCTOR. ° °PAIN CONTROL ° °1. First take acetaminophen (Tylenol) AND/or ibuprofen (Advil) to control your pain after surgery.  Follow directions on package.  Taking acetaminophen (Tylenol) and/or ibuprofen (Advil) regularly after surgery will help to control your pain and lower the amount of prescription pain medication you may need.  You should not take more than 4,000 mg (4 grams) of acetaminophen (Tylenol) in 24 hours.  You should not take ibuprofen (Advil), aleve, motrin, naprosyn or other NSAIDS if you have a history of stomach ulcers or chronic kidney disease.  °2. A prescription for pain medication may be given to you upon discharge.  Take your pain medication as prescribed, if you still have uncontrolled pain after taking acetaminophen (Tylenol) or ibuprofen (Advil). °3. Use ice packs to help control pain. °4. If you need a refill on your pain medication, please contact your pharmacy.  They will contact our office to request authorization. Prescriptions will not be filled after 5pm or on week-ends. ° °HOME MEDICATIONS °5. Take your usually prescribed medications unless otherwise directed. ° °DIET °6. You should follow a light diet the first few days after arrival home.  Be sure to include lots of fluids daily. Avoid fatty, fried foods.  ° °CONSTIPATION °7. It is common to experience some constipation after surgery and if you are taking pain medication.  Increasing fluid intake and taking a stool  softener (such as Colace) will usually help or prevent this problem from occurring.  A mild laxative (Milk of Magnesia or Miralax) should be taken according to package instructions if there are no bowel movements after 48 hours. ° °WOUND/INCISION CARE °8. Most patients will experience some swelling and bruising in the area of the incisions.  Ice packs will help.  Swelling and bruising can take several days to resolve.  °9. Unless discharge instructions indicate otherwise, follow guidelines below  °a. STERI-STRIPS - you may remove your outer bandages 48 hours after surgery, and you may shower at that time.  You have steri-strips (small skin tapes) in place directly over the incision.  These strips should be left on the skin for 7-10 days.   °b. DERMABOND/SKIN GLUE - you may shower in 24 hours.  The glue will flake off over the next 2-3 weeks. °10. Any sutures or staples will be removed at the office during your follow-up visit. ° °ACTIVITIES °11. You may resume regular (light) daily activities beginning the next day--such as daily self-care, walking, climbing stairs--gradually increasing activities as tolerated.  You may have sexual intercourse when it is comfortable.  Refrain from any heavy lifting or straining until approved by your doctor. °a. You may drive when you are no longer taking prescription pain medication, you can comfortably wear a seatbelt, and you can safely maneuver your car and apply brakes. ° °FOLLOW-UP °12. You should see your doctor in the office for a follow-up appointment approximately 2-3 weeks after your surgery.  You should have been given your post-op/follow-up appointment when   your surgery was scheduled.  If you did not receive a post-op/follow-up appointment, make sure that you call for this appointment within a day or two after you arrive home to insure a convenient appointment time. ° °OTHER INSTRUCTIONS ° °WHEN TO CALL YOUR DOCTOR: °1. Fever over 101.0 °2. Inability to  urinate °3. Continued bleeding from incision. °4. Increased pain, redness, or drainage from the incision. °5. Increasing abdominal pain ° °The clinic staff is available to answer your questions during regular business hours.  Please don’t hesitate to call and ask to speak to one of the nurses for clinical concerns.  If you have a medical emergency, go to the nearest emergency room or call 911.  A surgeon from [facility] is always on call at the hospital. °[address], [address], [place], [place]  [address] ? [address] [address], [place], [place]   [address] °[phone] ? [phone] ? FAX [phone] ° ° ° °

## 2018-12-21 NOTE — Progress Notes (Signed)
Pt had a yellow BM .small balls that made on big bm

## 2018-12-22 MED ORDER — AMOXICILLIN-POT CLAVULANATE 875-125 MG PO TABS: 1.0000 | ORAL_TABLET | Freq: Two times a day (BID) | ORAL | 0 refills | Status: AC

## 2018-12-22 MED ORDER — OXYCODONE HCL 5 MG PO TABS: 5.0000 mg | ORAL_TABLET | Freq: Four times a day (QID) | ORAL | 0 refills | Status: AC | PRN

## 2018-12-22 MED ORDER — FLUCONAZOLE 100 MG PO TABS: ORAL_TABLET | ORAL | 1 refills | Status: AC

## 2018-12-22 MED ORDER — DOCUSATE SODIUM 100 MG PO CAPS: 100.0000 mg | ORAL_CAPSULE | Freq: Two times a day (BID) | ORAL | 0 refills | Status: AC

## 2018-12-22 NOTE — Plan of Care (Signed)
  Problem: Education: Goal: Knowledge of General Education information will improve Description Including pain rating scale, medication(s)/side effects and non-pharmacologic comfort measures Outcome: Progressing   Problem: Clinical Measurements: Goal: Ability to maintain clinical measurements within normal limits will improve Outcome: Progressing Goal: Will remain free from infection Outcome: Progressing Goal: Diagnostic test results will improve Outcome: Progressing   Problem: Nutrition: Goal: Adequate nutrition will be maintained Outcome: Progressing   Problem: Elimination: Goal: Will not experience complications related to bowel motility Outcome: Progressing Goal: Will not experience complications related to urinary retention Outcome: Progressing

## 2018-12-22 NOTE — Discharge Summary (Signed)
[facility] Discharge Summary   Patient ID: Emily Hayden MRN: [id number] DOB/AGE: June 22, 1971 48 y.o.  Admit date: 12/16/2018 Discharge date: 12/22/2018  Admitting Diagnosis: Acute appendicitis with associated phlegmon  Discharge Diagnosis Patient Active Problem List   Diagnosis Date Noted  . Acute appendicitis 12/17/2018  . 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 Urinary frequency 04/05/2013  . Palpitations 04/05/2013  . Laryngopharyngeal reflux 04/05/2013    Consultants None  Imaging: No results found.  Procedures Dr. Johna SheriffHoxworth (12/17/18) - Laparoscopic Appendectomy  Hospital Course:  Emily Hayden is a 48yo female who [facility] with 1 day of worsening RLQ abdominal pain.  Workup showed included CT scan which showed likely acute appendicitis with phlegmon.  Patient was admitted and underwent procedure listed above. Intraoperatively she was found to have perforated appendicitis. Tolerated procedure well and was transferred to the floor.  She was kept on IV zosyn during admission, and discharged home on augmentin to complete 10 total days of antibiotics. Patient did have an ileus postoperatively, but this improved with time and bowel regimen, and diet was advanced as tolerated.  On POD5, the patient was voiding well, tolerating diet, ambulating well, pain well controlled, vital signs stable, incisions c/d/i and felt stable for discharge home.  Patient will follow up as below and knows to call with questions or concerns.    I have personally reviewed the patients medication history on the [place] controlled substance database.    Allergies as of 12/22/2018      Reactions   Ciprocinonide [fluocinolone]    Codeine    Compazine [prochlorperazine Edisylate]       Medication List    STOP taking these medications   ipratropium 0.03 % nasal spray Commonly known as:  ATROVENT   pantoprazole 40 MG tablet Commonly known as:  PROTONIX     TAKE these  medications   acetaminophen 500 MG tablet Commonly known as:  TYLENOL Take 1,000 mg by mouth every 6 (six) hours as needed for mild pain, moderate pain or headache.   ALPRAZolam 0.5 MG tablet Commonly known as:  XANAX Take 0.5 mg by mouth at bedtime as needed.   amoxicillin-clavulanate 875-125 MG tablet Commonly known as:  AUGMENTIN Take 1 tablet by mouth 2 (two) times daily.   CHLOR-TRIMETON 4 MG tablet Generic drug:  chlorpheniramine Take 4 mg by mouth at bedtime as needed for allergies.   cyclobenzaprine 10 MG tablet Commonly known as:  FLEXERIL Take 1 tablet (10 mg total) by mouth at bedtime.   docusate sodium 100 MG capsule Commonly known as:  COLACE Take 1 capsule (100 mg total) by mouth 2 (two) times daily.   EVEKEO 10 MG Tabs Generic drug:  Amphetamine Sulfate Take 10 mg by mouth daily after breakfast.   fluconazole 100 MG tablet Commonly known as:  DIFLUCAN Take 1 tablet (100mg ) once as needed for vaginal yeast infection.   hydrOXYzine 25 MG tablet Commonly known as:  ATARAX/VISTARIL Take 1 tablet (25 mg total) by mouth every 8 (eight) hours as needed for itching.   ibuprofen 200 MG tablet Commonly known as:  ADVIL,MOTRIN Take 400 mg by mouth at bedtime.   magnesium hydroxide 400 MG/5ML suspension Commonly known as:  MILK OF MAGNESIA Take 15 mLs by mouth daily as needed for mild constipation.   Multiple Vitamin Caps Take 1 capsule by mouth 2 (two) times daily. Reported on 04/19/2016   OVER THE COUNTER MEDICATION Take 1 capsule by mouth 2 (two) times daily.  oxyCODONE 5 MG immediate release tablet Commonly known as:  Oxy IR/ROXICODONE Take 1-2 tablets (5-10 mg total) by mouth every 6 (six) hours as needed for severe pain.   VITAMIN B-3 PO Take 1 tablet by mouth 2 (two) times daily.        Follow-up Information    [facility] [place].   Specialty:  General Surgery Why:  Your appointment is 01/14 at 10:15 am. Please  arrive 30 minutes early to check in and fill out paperwork. Bring photo ID and insurance information. Contact information: [address] [place] [id number] [phone]          Signed: Franne FortsBrooke A Maizee Reinhold, [facility] 12/22/2018, 10:29 AM Pager: [phone] Mon 7:00 am -11:30 AM Tues-Fri 7:00 am-4:30 pm Sat-Sun 7:00 am-11:30 am

## 2018-12-22 NOTE — Progress Notes (Signed)
Patient ID: Emily Hayden, female   DOB: 1971-08-25, 48 y.o.   MRN: [id number]  Doing well Having BM's now Abdomen benign  Plan: Discharge home

## 2018-12-23 ENCOUNTER — Other Ambulatory Visit: Payer: Self-pay | Admitting: Surgery

## 2020-01-16 IMAGING — CT CT ABD-PELV W/ CM
2 of 5 series · 17 of 46 positions shown, 19 images · IV contrast (ISOVUE)
Comparison: None.

CLINICAL DATA: Acute generalized abdomen pain.

EXAM:
CT ABDOMEN AND PELVIS WITH CONTRAST
TECHNIQUE: Multidetector CT imaging of the abdomen and pelvis was performed
using the standard protocol following bolus administration of
intravenous contrast.
CONTRAST:  100mL 1J357H-FBB IOPAMIDOL (1J357H-FBB) INJECTION 61%

[Series 2: axial st · axial · 0.98mm/px · z∈[+1600,+1980]mm · 14 of 88 slices shown, 16 images]
[im 6/88  soft-tissue]
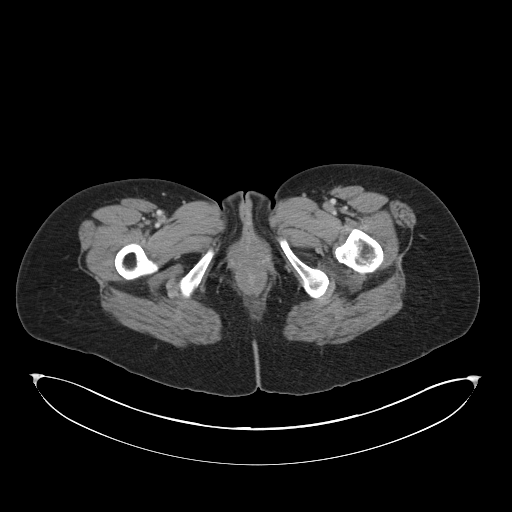
[im 6/88  bone]
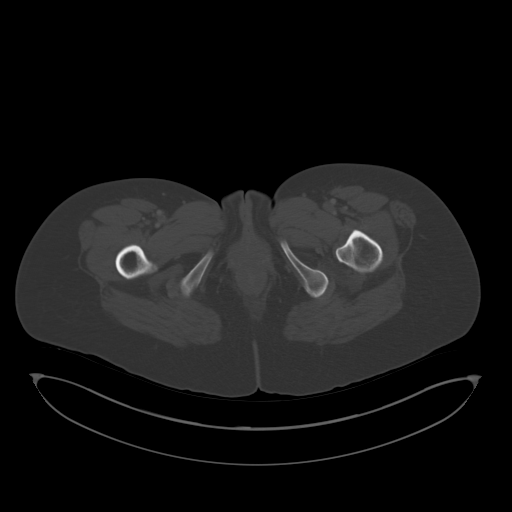
[im 12/88  soft-tissue]
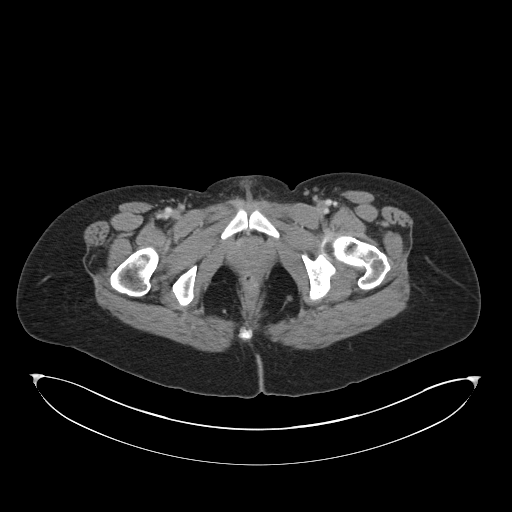
[im 18/88  soft-tissue]
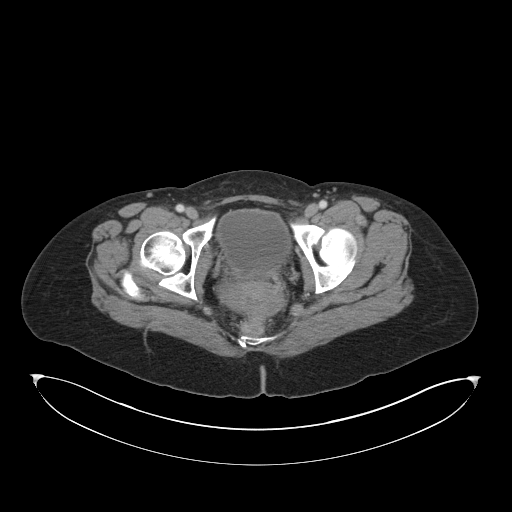
[im 24/88  soft-tissue]
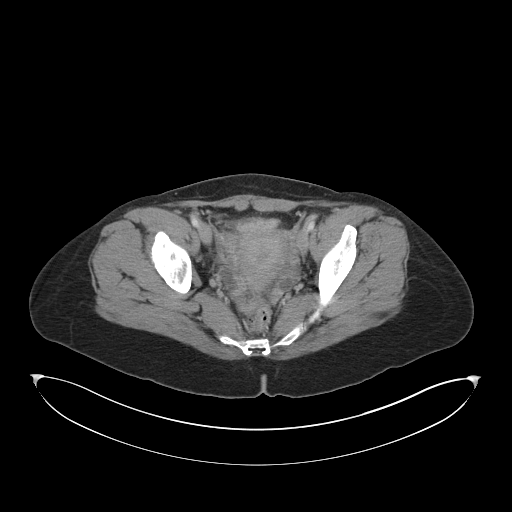
[im 30/88  soft-tissue]
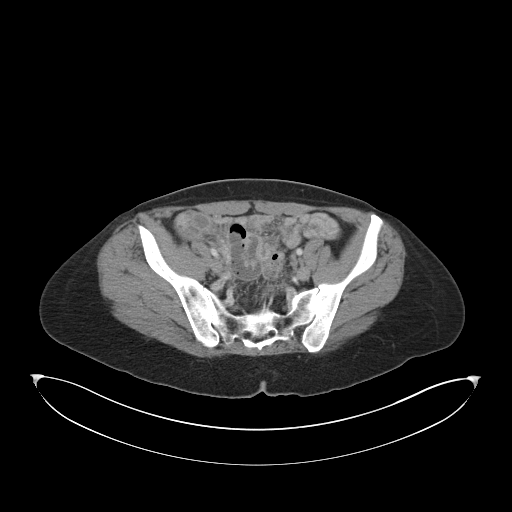
[im 35/88  soft-tissue]
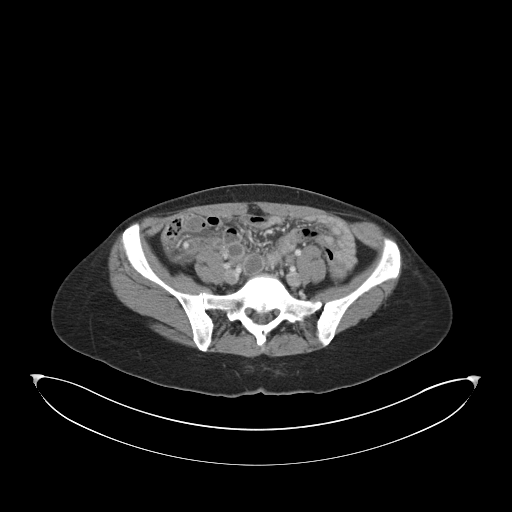
[im 41/88  soft-tissue]
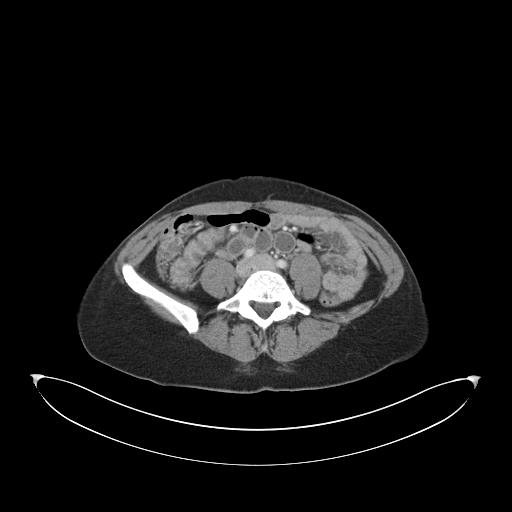
[im 47/88  soft-tissue]
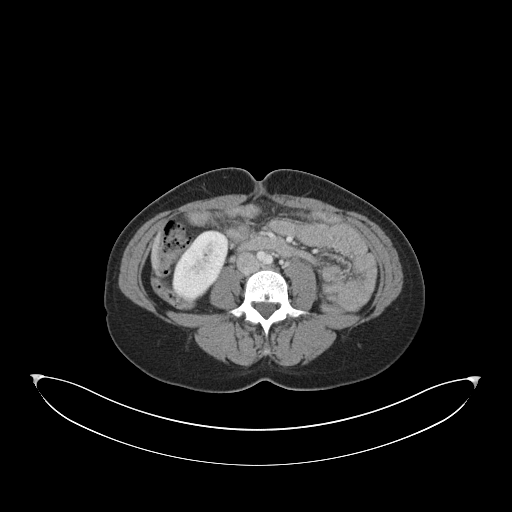
[im 53/88  soft-tissue]
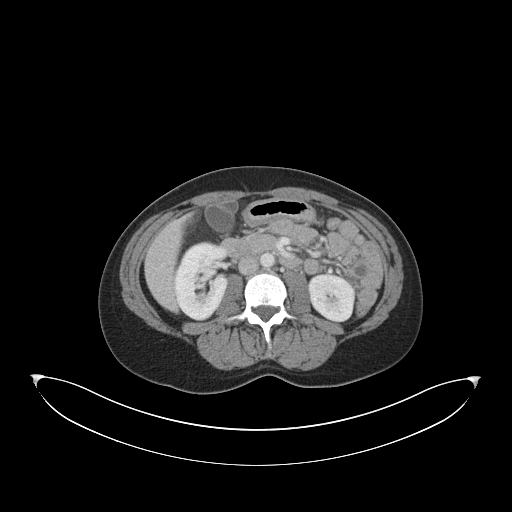
[im 53/88  bone]
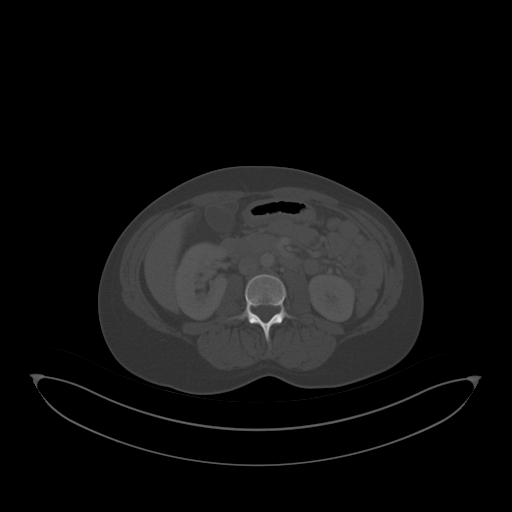
[im 59/88  soft-tissue]
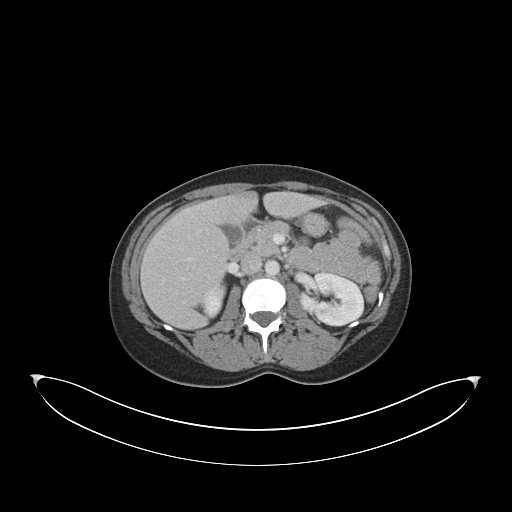
[im 64/88  soft-tissue]
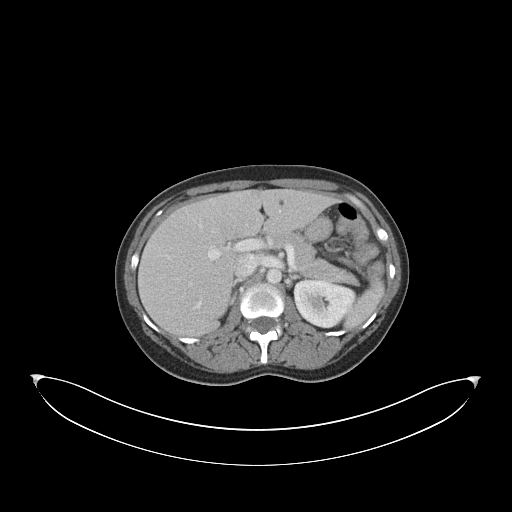
[im 70/88  soft-tissue]
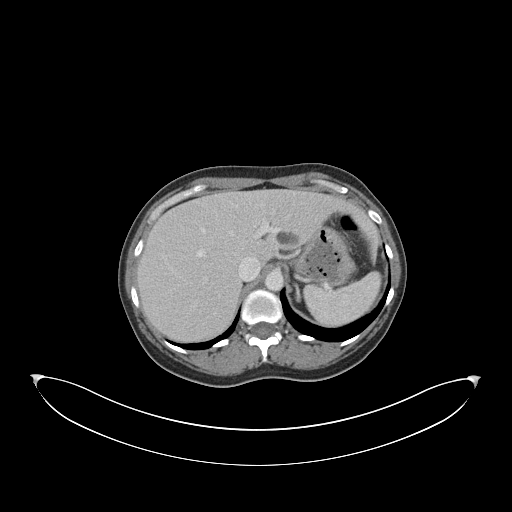
[im 76/88  soft-tissue]
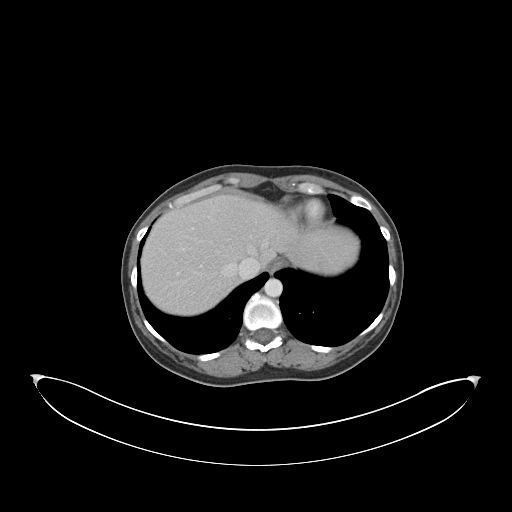
[im 82/88  soft-tissue]
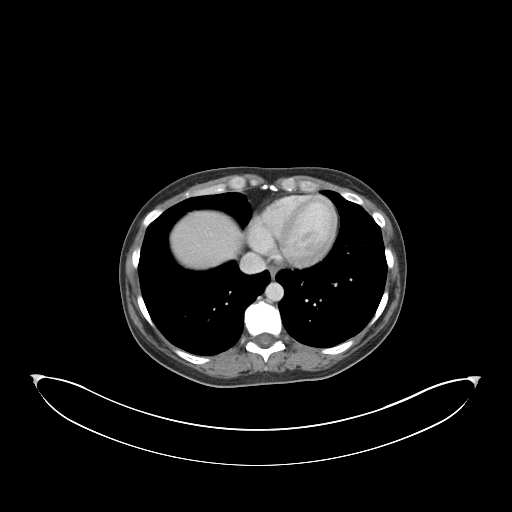

[Series 4: coronal st · coronal · 0.83mm/px · 3 of 101 slices shown]
[im 34/101  soft-tissue]
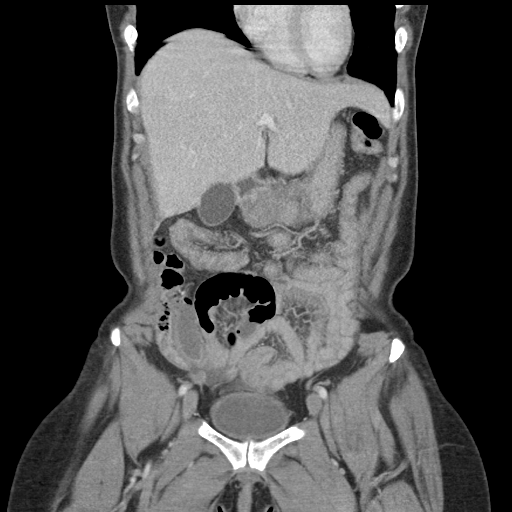
[im 45/101  soft-tissue]
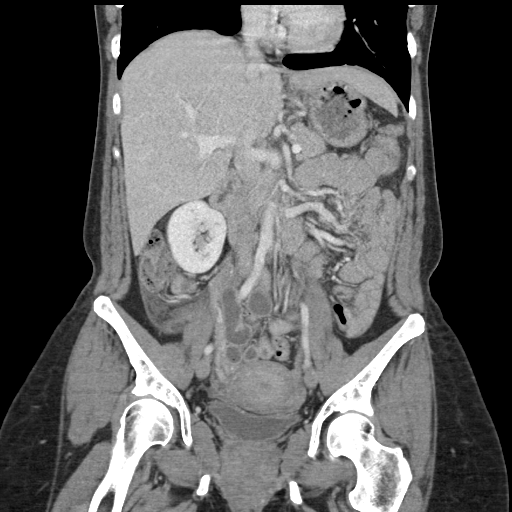
[im 56/101  soft-tissue]
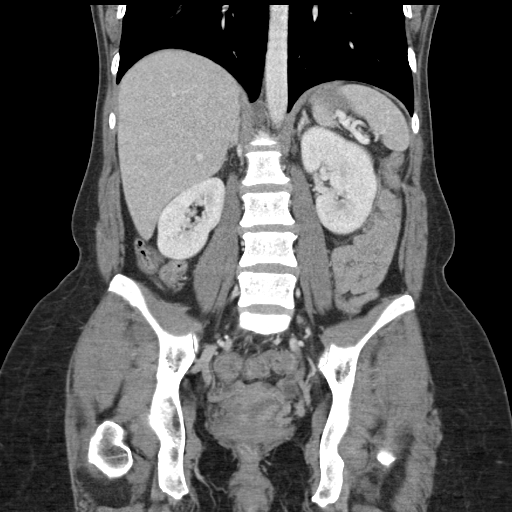

[17 of 46 positions shown; findings below may reference images not displayed]

FINDINGS: Lower chest: No acute abnormality.

Hepatobiliary: Versus are identified within the left lobe liver.
There is focal fatty infiltration of liver at the falciform
ligament. The gallbladder is normal. The biliary tree is normal.

Pancreas: Unremarkable. No pancreatic ductal dilatation or
surrounding inflammatory changes.

Spleen: Normal in size without focal abnormality.

Adrenals/Urinary Tract: Adrenal glands are unremarkable. Kidneys are
normal, without renal calculi, focal lesion, or hydronephrosis.
Bladder is unremarkable.

Stomach/Bowel: There is a focal bowel loop with enhancing wall and
surrounding inflammation and internal 8 mm calcification in the
right lower quadrant. This may represent an abnormal inflamed
appendix. There are several dilated small bowel loops in the right
lower quadrant, this is most likely due to focal ileus from
surrounding inflammatory change. The colon is normal. The stomach is
normal.

Vascular/Lymphatic: No significant vascular findings are present. No
enlarged abdominal or pelvic lymph nodes.

Reproductive: Uterus and bilateral adnexa are unremarkable.

Other: None.

Musculoskeletal: No acute or significant osseous findings.
IMPRESSION: There is a focal bowel loop with enhancing wall and surrounding
inflammation and internal 8 mm calcification in the right lower
quadrant. This may represent an abnormal inflamed appendix.

Several dilated small bowel loops in the right lower quadrant, this
is most likely due to focal ileus from surrounding inflammatory
change.
